# Patient Record
Sex: Male | Born: 1940 | Race: Black or African American | Hispanic: No | Smoking: Former smoker
Health system: Southern US, Community
[De-identification: ages and names within clinical notes are randomized; demographics above are authoritative.]

## PROBLEM LIST (undated history)

## (undated) DIAGNOSIS — M545 Low back pain, unspecified: Secondary | ICD-10-CM

## (undated) DIAGNOSIS — M179 Osteoarthritis of knee, unspecified: Secondary | ICD-10-CM

## (undated) DIAGNOSIS — D649 Anemia, unspecified: Secondary | ICD-10-CM

## (undated) DIAGNOSIS — I4891 Unspecified atrial fibrillation: Secondary | ICD-10-CM

## (undated) DIAGNOSIS — M1710 Unilateral primary osteoarthritis, unspecified knee: Secondary | ICD-10-CM

## (undated) DIAGNOSIS — K219 Gastro-esophageal reflux disease without esophagitis: Secondary | ICD-10-CM

## (undated) DIAGNOSIS — N189 Chronic kidney disease, unspecified: Secondary | ICD-10-CM

## (undated) DIAGNOSIS — N529 Male erectile dysfunction, unspecified: Secondary | ICD-10-CM

## (undated) DIAGNOSIS — M1990 Unspecified osteoarthritis, unspecified site: Secondary | ICD-10-CM

## (undated) DIAGNOSIS — I2699 Other pulmonary embolism without acute cor pulmonale: Secondary | ICD-10-CM

## (undated) DIAGNOSIS — G4700 Insomnia, unspecified: Secondary | ICD-10-CM

## (undated) DIAGNOSIS — T7840XA Allergy, unspecified, initial encounter: Secondary | ICD-10-CM

## (undated) DIAGNOSIS — N400 Enlarged prostate without lower urinary tract symptoms: Secondary | ICD-10-CM

## (undated) DIAGNOSIS — R600 Localized edema: Secondary | ICD-10-CM

## (undated) DIAGNOSIS — I10 Essential (primary) hypertension: Secondary | ICD-10-CM

## (undated) DIAGNOSIS — Z7901 Long term (current) use of anticoagulants: Secondary | ICD-10-CM

## (undated) DIAGNOSIS — F17201 Nicotine dependence, unspecified, in remission: Secondary | ICD-10-CM

## (undated) HISTORY — DX: Nicotine dependence, unspecified, in remission: F17.201

## (undated) HISTORY — DX: Anemia, unspecified: D64.9

## (undated) HISTORY — DX: Unilateral primary osteoarthritis, unspecified knee: M17.10

## (undated) HISTORY — PX: VARICOSE VEIN SURGERY: SHX832

## (undated) HISTORY — DX: Other pulmonary embolism without acute cor pulmonale: I26.99

## (undated) HISTORY — DX: Essential (primary) hypertension: I10

## (undated) HISTORY — DX: Osteoarthritis of knee, unspecified: M17.9

## (undated) HISTORY — DX: Insomnia, unspecified: G47.00

## (undated) HISTORY — PX: VEIN SURGERY: SHX48

## (undated) HISTORY — DX: Chronic kidney disease, unspecified: N18.9

## (undated) HISTORY — DX: Benign prostatic hyperplasia without lower urinary tract symptoms: N40.0

## (undated) HISTORY — PX: CATARACT EXTRACTION W/ INTRAOCULAR LENS IMPLANT: SHX1309

## (undated) HISTORY — DX: Gastro-esophageal reflux disease without esophagitis: K21.9

## (undated) HISTORY — DX: Low back pain: M54.5

## (undated) HISTORY — DX: Low back pain, unspecified: M54.50

## (undated) HISTORY — DX: Long term (current) use of anticoagulants: Z79.01

## (undated) HISTORY — DX: Unspecified atrial fibrillation: I48.91

## (undated) HISTORY — DX: Male erectile dysfunction, unspecified: N52.9

## (undated) HISTORY — DX: Unspecified osteoarthritis, unspecified site: M19.90

---

## 2009-12-10 HISTORY — PX: COLONOSCOPY: SHX174

## 2012-06-16 ENCOUNTER — Inpatient Hospital Stay (HOSPITAL_COMMUNITY)
Admission: EM | Admit: 2012-06-16 | Discharge: 2012-06-17 | DRG: 916 | Disposition: A | Discharge status: Home / Self Care | Payer: Medicare Other | Attending: Internal Medicine | Admitting: Internal Medicine

## 2012-06-16 ENCOUNTER — Encounter (HOSPITAL_COMMUNITY): Payer: Self-pay | Admitting: *Deleted

## 2012-06-16 DIAGNOSIS — R221 Localized swelling, mass and lump, neck: Secondary | ICD-10-CM

## 2012-06-16 DIAGNOSIS — Z79899 Other long term (current) drug therapy: Secondary | ICD-10-CM

## 2012-06-16 DIAGNOSIS — Z86711 Personal history of pulmonary embolism: Secondary | ICD-10-CM

## 2012-06-16 DIAGNOSIS — Z87891 Personal history of nicotine dependence: Secondary | ICD-10-CM

## 2012-06-16 DIAGNOSIS — T7840XA Allergy, unspecified, initial encounter: Secondary | ICD-10-CM

## 2012-06-16 DIAGNOSIS — I4891 Unspecified atrial fibrillation: Secondary | ICD-10-CM | POA: Diagnosis present

## 2012-06-16 DIAGNOSIS — D72819 Decreased white blood cell count, unspecified: Secondary | ICD-10-CM

## 2012-06-16 DIAGNOSIS — R791 Abnormal coagulation profile: Secondary | ICD-10-CM | POA: Diagnosis present

## 2012-06-16 DIAGNOSIS — D6832 Hemorrhagic disorder due to extrinsic circulating anticoagulants: Secondary | ICD-10-CM

## 2012-06-16 DIAGNOSIS — T783XXA Angioneurotic edema, initial encounter: Principal | ICD-10-CM

## 2012-06-16 DIAGNOSIS — T45515A Adverse effect of anticoagulants, initial encounter: Secondary | ICD-10-CM | POA: Diagnosis present

## 2012-06-16 DIAGNOSIS — E119 Type 2 diabetes mellitus without complications: Secondary | ICD-10-CM | POA: Diagnosis present

## 2012-06-16 DIAGNOSIS — M1710 Unilateral primary osteoarthritis, unspecified knee: Secondary | ICD-10-CM | POA: Diagnosis present

## 2012-06-16 DIAGNOSIS — I10 Essential (primary) hypertension: Secondary | ICD-10-CM

## 2012-06-16 DIAGNOSIS — D649 Anemia, unspecified: Secondary | ICD-10-CM

## 2012-06-16 DIAGNOSIS — Z7901 Long term (current) use of anticoagulants: Secondary | ICD-10-CM

## 2012-06-16 DIAGNOSIS — R220 Localized swelling, mass and lump, head: Secondary | ICD-10-CM

## 2012-06-16 HISTORY — DX: Allergy, unspecified, initial encounter: T78.40XA

## 2012-06-16 HISTORY — DX: Localized edema: R60.0

## 2012-06-16 LAB — MRSA PCR SCREENING: MRSA by PCR: NEGATIVE

## 2012-06-16 LAB — BASIC METABOLIC PANEL
Chloride: 104 mEq/L (ref 96–112)
GFR calc Af Amer: 75 mL/min — ABNORMAL LOW (ref >90)
GFR calc non Af Amer: 65 mL/min — ABNORMAL LOW (ref >90)
Potassium: 4.1 mEq/L (ref 3.5–5.1)
Sodium: 139 mEq/L (ref 135–145)

## 2012-06-16 LAB — CBC WITH DIFFERENTIAL/PLATELET
Basophils Relative: 1 % (ref 0–1)
Eosinophils Absolute: 0.1 10*3/uL (ref 0.0–0.7)
Lymphs Abs: 1.1 10*3/uL (ref 0.7–4.0)
MCH: 29.9 pg (ref 26.0–34.0)
MCHC: 31.6 g/dL (ref 30.0–36.0)
Neutro Abs: 2.4 10*3/uL (ref 1.7–7.7)
Neutrophils Relative %: 63 % (ref 43–77)
Platelets: 188 10*3/uL (ref 150–400)
RBC: 4.25 MIL/uL (ref 4.22–5.81)

## 2012-06-16 LAB — PROTIME-INR: INR: 2.13 — ABNORMAL HIGH (ref 0.00–1.49)

## 2012-06-16 MED ORDER — WARFARIN SODIUM 5 MG PO TABS: 5.0000 mg | ORAL_TABLET | Freq: Every day | ORAL | Status: DC

## 2012-06-16 MED ORDER — ONDANSETRON HCL 4 MG PO TABS: 4.0000 mg | ORAL_TABLET | Freq: Four times a day (QID) | ORAL | Status: DC | PRN

## 2012-06-16 MED ORDER — ALBUTEROL SULFATE (5 MG/ML) 0.5% IN NEBU: 2.5000 mg | INHALATION_SOLUTION | RESPIRATORY_TRACT | Status: DC | PRN

## 2012-06-16 MED ORDER — METOPROLOL TARTRATE 50 MG PO TABS
50.0000 mg | ORAL_TABLET | Freq: Two times a day (BID) | ORAL | Status: DC
  Administered 2012-06-16: 50 mg via ORAL
  Filled 2012-06-16: qty 1

## 2012-06-16 MED ORDER — WARFARIN SODIUM 6 MG PO TABS
6.0000 mg | ORAL_TABLET | Freq: Once | ORAL | Status: AC
  Administered 2012-06-16: 6 mg via ORAL
  Filled 2012-06-16: qty 1

## 2012-06-16 MED ORDER — ACETAMINOPHEN 325 MG PO TABS: 650.0000 mg | ORAL_TABLET | Freq: Four times a day (QID) | ORAL | Status: DC | PRN

## 2012-06-16 MED ORDER — HYDROCODONE-ACETAMINOPHEN 5-325 MG PO TABS
1.0000 | ORAL_TABLET | Freq: Every day | ORAL | Status: DC
  Administered 2012-06-16: 1 via ORAL
  Filled 2012-06-16: qty 1

## 2012-06-16 MED ORDER — DIPHENHYDRAMINE HCL 25 MG PO CAPS
25.0000 mg | ORAL_CAPSULE | Freq: Once | ORAL | Status: AC
  Administered 2012-06-16: 25 mg via ORAL
  Filled 2012-06-16: qty 1

## 2012-06-16 MED ORDER — FAMOTIDINE 20 MG PO TABS
20.0000 mg | ORAL_TABLET | Freq: Once | ORAL | Status: AC
  Administered 2012-06-16: 20 mg via ORAL
  Filled 2012-06-16: qty 1

## 2012-06-16 MED ORDER — HYDROCODONE-ACETAMINOPHEN 5-325 MG PO TABS
1.0000 | ORAL_TABLET | Freq: Two times a day (BID) | ORAL | Status: DC
  Administered 2012-06-16: 1 via ORAL
  Filled 2012-06-16: qty 1

## 2012-06-16 MED ORDER — WARFARIN - PHARMACIST DOSING INPATIENT: Freq: Every day | Status: DC

## 2012-06-16 MED ORDER — METHYLPREDNISOLONE SODIUM SUCC 125 MG IJ SOLR
60.0000 mg | Freq: Two times a day (BID) | INTRAMUSCULAR | Status: DC
  Administered 2012-06-16 – 2012-06-17 (×2): 60 mg via INTRAVENOUS
  Filled 2012-06-16 (×3): qty 2

## 2012-06-16 MED ORDER — FAMOTIDINE IN NACL 20-0.9 MG/50ML-% IV SOLN
20.0000 mg | Freq: Two times a day (BID) | INTRAVENOUS | Status: DC
  Administered 2012-06-16 (×2): 20 mg via INTRAVENOUS
  Filled 2012-06-16 (×3): qty 50

## 2012-06-16 MED ORDER — PREDNISONE 20 MG PO TABS
60.0000 mg | ORAL_TABLET | Freq: Once | ORAL | Status: AC
  Administered 2012-06-16: 60 mg via ORAL
  Filled 2012-06-16: qty 3

## 2012-06-16 MED ORDER — METOPROLOL TARTRATE 50 MG PO TABS
50.0000 mg | ORAL_TABLET | Freq: Every day | ORAL | Status: DC
  Administered 2012-06-16: 50 mg via ORAL
  Filled 2012-06-16 (×2): qty 1

## 2012-06-16 MED ORDER — POTASSIUM CHLORIDE IN NACL 20-0.9 MEQ/L-% IV SOLN
INTRAVENOUS | Status: DC
  Administered 2012-06-16 – 2012-06-17 (×2): via INTRAVENOUS
  Filled 2012-06-16: qty 1000

## 2012-06-16 MED ORDER — WARFARIN SODIUM 1 MG PO TABS: 1.0000 mg | ORAL_TABLET | ORAL | Status: DC

## 2012-06-16 MED ORDER — ONDANSETRON HCL 4 MG/2ML IJ SOLN: 4.0000 mg | Freq: Four times a day (QID) | INTRAMUSCULAR | Status: DC | PRN

## 2012-06-16 MED ORDER — DIPHENHYDRAMINE HCL 50 MG/ML IJ SOLN
25.0000 mg | Freq: Four times a day (QID) | INTRAMUSCULAR | Status: DC
  Administered 2012-06-16 – 2012-06-17 (×4): 25 mg via INTRAVENOUS
  Filled 2012-06-16 (×4): qty 1

## 2012-06-16 MED ORDER — ACETAMINOPHEN 650 MG RE SUPP: 650.0000 mg | Freq: Four times a day (QID) | RECTAL | Status: DC | PRN

## 2012-06-16 NOTE — ED Notes (Signed)
Pt woke up with tongue swelling. Pt also c/o sob.

## 2012-06-16 NOTE — ED Notes (Signed)
Pam from ICU called and will call when bed available in unit. Pt resting in bed with nad noted. Handling secretions without difficulty. Pt watching TV

## 2012-06-16 NOTE — Progress Notes (Signed)
ANTICOAGULATION CONSULT NOTE - Initial Consult  Pharmacy Consult for Warfarin Indication: VTE  No Known Allergies  Patient Measurements: Height: 5\' 11"  (180.3 cm) Weight: 205 lb (92.987 kg) IBW/kg (Calculated) : 75.3   Vital Signs: Temp: 98.4 F (36.9 C) (07/08 1119) Temp src: Oral (07/08 1119) BP: 150/89 mmHg (07/08 1119) Pulse Rate: 85  (07/08 1119)  Labs:  Basename 06/16/12 0753  HGB 12.7*  HCT 40.2  PLT 188  APTT --  LABPROT 24.2*  INR 2.13*  HEPARINUNFRC --  CREATININE 1.11  CKTOTAL --  CKMB --  TROPONINI --    Estimated Creatinine Clearance: 71.1 ml/min (by C-G formula based on Cr of 1.11).   Medical History: Past Medical History  Diagnosis Date  . PE (pulmonary thromboembolism) 2003 or 2004  . Hypertension   . GERD (gastroesophageal reflux disease)   . Bilateral chronic knee pain DJD  . Leg edema, right Chronic    Status post vein stripping for varicose veins 1990s    Medications:  Scheduled:    . diphenhydrAMINE  25 mg Oral Once  . diphenhydrAMINE  25 mg Intravenous Q6H  . famotidine  20 mg Oral Once  . HYDROcodone-acetaminophen  1 tablet Oral BID  . methylPREDNISolone (SOLU-MEDROL) injection  60 mg Intravenous Q12H  . metoprolol  50 mg Oral BID  . predniSONE  60 mg Oral Once  . warfarin  1 mg Oral 3 times weekly  . warfarin  5 mg Oral Daily    Assessment: Okay for Protocol Home Dose 5mg  T-Th-Sat-Sun and 6mg  M-W-F.  Goal of Therapy:  INR 2-3   Plan:  Warfarin 6mg  PO x 1 today. Daily PT/INR.  Mady Gemma 06/16/2012,11:44 AM

## 2012-06-16 NOTE — ED Notes (Signed)
ICU room 7 being cleaned at present time- waiting to give report.

## 2012-06-16 NOTE — ED Notes (Signed)
Tongue swollen. Handling secretions without difficulty. resp even/nonlabored. sats maintained on RA

## 2012-06-16 NOTE — H&P (Signed)
Allen Marshall MRN: [id number] DOB/AGE: [birth year] 71 y.o. Primary Care Physician:SIDHU,AMANJOT, MD Admit date: 06/16/2012 Chief Complaint: Tongue swelling HPI: The patient is a 71 year old man with a history significant for pulmonary embolism and hypertension, who presents to the emergency department this morning with a chief complaint of tongue swelling. This morning, at approximately 3:00 this morning, he got up to use the bathroom. He noticed that his tongue and mouth "felt funny." As each hour when on, his tongue became more and more swollen. He felt that he may have bitten his tongue while he was asleep. As time went on, he became concerned, but he had no shortness of breath or difficulty swallowing. He did and does have trouble speaking because of his swollen tongue. He denies taking any new medications. He is not on an ACE inhibitor. He denies itching, rashes, chest pain, pleurisy, fever, chills, shortness of breath, nausea, vomiting, diarrhea, or abdominal pain. Before he went to bed last night, he ate a bowl of chili from [facility]. He had not eaten chili from [facility] before. Late afternoon, he ate watermelon which he had done before on many occasions.  In the emergency department, he is hypertensive with a blood pressure 170/112, but hemodynamically stable and afebrile. He is oxygenating 97% on room air. His lab data are significant for a WBC of 3.9, hemoglobin of 12.7, an INR of 2.13. He is being admitted for further evaluation and management.  Past Medical History  Diagnosis Date  . PE (pulmonary thromboembolism) 2003 or 2004  . Hypertension   . GERD (gastroesophageal reflux disease)   . Bilateral chronic knee pain DJD  . Leg edema, right Chronic    Status post vein stripping for varicose veins 1990s    Past Surgical History  Procedure Date  . Varicose vein surgery 1970s    Prior to Admission medications   Medication Sig Start Date End Date Taking? Authorizing Provider  acetaminophen  (TYLENOL) 500 MG tablet Take 1,000 mg by mouth every 6 (six) hours as needed. Pain   Yes Historical Provider, MD  Cyanocobalamin (VITAMIN B-12) 2000 MCG TBCR Take 2,000 mcg by mouth daily.   Yes Historical Provider, MD  furosemide (LASIX) 20 MG tablet Take 20 mg by mouth 3 (three) times a week. Takes on Mon, Wed, and Fri.   Yes Historical Provider, MD  HYDROcodone-acetaminophen (NORCO) 5-325 MG per tablet Take 1 tablet by mouth 2 (two) times daily. Pain   Yes Historical Provider, MD  metoprolol (LOPRESSOR) 50 MG tablet Take 50 mg by mouth 2 (two) times daily.   Yes Historical Provider, MD  omeprazole (PRILOSEC) 20 MG capsule Take 20 mg by mouth daily.   Yes Historical Provider, MD  warfarin (COUMADIN) 1 MG tablet Take 1 mg by mouth 3 (three) times a week. Takes on Mon, Wed, and Fri along with the 5 mg tablet to make 6 mg on these days.   Yes Historical Provider, MD  warfarin (COUMADIN) 5 MG tablet Take 5 mg by mouth daily.   Yes Historical Provider, MD    Allergies: No Known Allergies  Family history: His mother is 77 years of age and has a history of heart disease and esophageal reflux. His father died of Alzheimer's disease in his 80s.  Social History: The patient is widowed. He has no children. He recently moved to [facility] from [place]. He is a retired [employer] architect. He stopped drinking alcohol and smoking cigarettes approximately 10 years ago.     ROS: As above  in history present illness. In addition, he has chronic right leg swelling. Otherwise, review of systems is negative.  PHYSICAL EXAM: Blood pressure 150/89, pulse 85, temperature 98.4 F (36.9 C), temperature source Oral, resp. rate 16, height 5\' 11"  (1.803 m), weight 92.987 kg (205 lb), SpO2 97.00%. General: Pleasant alert 71 year old after American man laying in bed, in no acute distress. HEENT: Head is normocephalic, nontraumatic. Pupils are equal, round, and reactive to light. Extraocular movements are intact.  Conjunctivae are clear. Sclerae are white. Oropharynx reveals a large swollen tongue with no excoriations, erythema, or lacerations. Posterior exudates are absent. No posterior erythema. Gums are in good repair. Mucous membranes are mildly dry. Neck: Supple, no adenopathy, no thyromegaly, no JVD. Lungs: Clear to auscultation bilaterally. Breathing is nonlabored. Heart: S1, S2, with no murmurs rubs or gallops. Abdomen: Positive bowel sounds, soft, nontender, nondistended. Extremities: Bandage on the right leg not examined yet. Globally, 2-3+ nonpitting edema which the patient says is chronic. No pedal edema or pretibial edema of his left leg. No acute hot red joints. Skin: No rashes. Good turgor. Neurologic: Cranial nerves II through XII are intact, except for mild dysarthria because of the edematous tongue. Strength is 5 over 5 throughout. Sensation is intact. Psychiatric: He is alert and oriented x3. His affect is pleasant. His speech is clear. He is cooperative.  Basic Metabolic Panel:  Basename 06/16/12 0753  NA 139  K 4.1  CL 104  CO2 27  GLUCOSE 109*  BUN 12  CREATININE 1.11  CALCIUM 9.4  MG --  PHOS --   Liver Function Tests: No results found for this basename: AST:2,ALT:2,ALKPHOS:2,BILITOT:2,PROT:2,ALBUMIN:2 in the last 72 hours No results found for this basename: LIPASE:2,AMYLASE:2 in the last 72 hours No results found for this basename: AMMONIA:2 in the last 72 hours CBC:  Basename 06/16/12 0753  WBC 3.9*  NEUTROABS 2.4  HGB 12.7*  HCT 40.2  MCV 94.6  PLT 188   Cardiac Enzymes: No results found for this basename: CKTOTAL:3,CKMB:3,CKMBINDEX:3,TROPONINI:3 in the last 72 hours BNP: No results found for this basename: PROBNP:3 in the last 72 hours D-Dimer: No results found for this basename: DDIMER:2 in the last 72 hours CBG: No results found for this basename: GLUCAP:6 in the last 72 hours Hemoglobin A1C: No results found for this basename: HGBA1C in the last 72  hours Fasting Lipid Panel: No results found for this basename: CHOL,HDL,LDLCALC,TRIG,CHOLHDL,LDLDIRECT in the last 72 hours Thyroid Function Tests: No results found for this basename: TSH,T4TOTAL,FREET4,T3FREE,THYROIDAB in the last 72 hours Anemia Panel: No results found for this basename: VITAMINB12,FOLATE,FERRITIN,TIBC,IRON,RETICCTPCT in the last 72 hours Coagulation:  Basename 06/16/12 0753  LABPROT 24.2*  INR 2.13*   Urine Drug Screen: Drugs of Abuse  No results found for this basename: labopia, cocainscrnur, labbenz, amphetmu, thcu, labbarb    Alcohol Level: No results found for this basename: ETH:2 in the last 72 hours Urinalysis: No results found for this basename: COLORURINE:2,APPERANCEUR:2,LABSPEC:2,PHURINE:2,GLUCOSEU:2,HGBUR:2,BILIRUBINUR:2,KETONESUR:2,PROTEINUR:2,UROBILINOGEN:2,NITRITE:2,LEUKOCYTESUR:2 in the last 72 hours   No results found for this or any previous visit (from the past 240 hour(s)).   Results for orders placed during the hospital encounter of 06/16/12 (from the past 48 hour(s))  CBC WITH DIFFERENTIAL     Status: Abnormal   Collection Time   06/16/12  7:53 AM      Component Value Range Comment   WBC 3.9 (*) 4.0 - 10.5 K/uL    RBC 4.25  4.22 - 5.81 MIL/uL    Hemoglobin 12.7 (*) 13.0 - 17.0 g/dL  HCT 40.2  39.0 - 52.0 %    MCV 94.6  78.0 - 100.0 fL    MCH 29.9  26.0 - 34.0 pg    MCHC 31.6  30.0 - 36.0 g/dL    RDW 16.1  09.6 - 04.5 %    Platelets 188  150 - 400 K/uL    Neutrophils Relative 63  43 - 77 %    Neutro Abs 2.4  1.7 - 7.7 K/uL    Lymphocytes Relative 29  12 - 46 %    Lymphs Abs 1.1  0.7 - 4.0 K/uL    Monocytes Relative 5  3 - 12 %    Monocytes Absolute 0.2  0.1 - 1.0 K/uL    Eosinophils Relative 2  0 - 5 %    Eosinophils Absolute 0.1  0.0 - 0.7 K/uL    Basophils Relative 1  0 - 1 %    Basophils Absolute 0.0  0.0 - 0.1 K/uL   BASIC METABOLIC PANEL     Status: Abnormal   Collection Time   06/16/12  7:53 AM      Component Value Range  Comment   Sodium 139  135 - 145 mEq/L    Potassium 4.1  3.5 - 5.1 mEq/L    Chloride 104  96 - 112 mEq/L    CO2 27  19 - 32 mEq/L    Glucose, Bld 109 (*) 70 - 99 mg/dL    BUN 12  6 - 23 mg/dL    Creatinine, Ser 4.09  0.50 - 1.35 mg/dL    Calcium 9.4  8.4 - 81.1 mg/dL    GFR calc non Af Amer 65 (*) >90 mL/min    GFR calc Af Amer 75 (*) >90 mL/min   PROTIME-INR     Status: Abnormal   Collection Time   06/16/12  7:53 AM      Component Value Range Comment   Prothrombin Time 24.2 (*) 11.6 - 15.2 seconds    INR 2.13 (*) 0.00 - 1.49     No results found.  Impression:  Principal Problem:  *Angioedema Active Problems:  Tongue swelling  HTN (hypertension), malignant  Leukopenia  Anemia  Hx pulmonary embolism  Warfarin-induced coagulopathy   This is a 71 year old man who presents with tongue swelling/angioedema. Etiology not clearly known, but the likely culprit is some substance in the chili which he had never  eaten before at [facility]. Would consider medications as etiology, as in rare occasions, proton pump inhibitors and beta blockers can cause angioedema. However, secondary to the temporal relationship of the chili and his symptomatology, it is likely that a possible allergic reaction to some substance in the chili had caused his swelling. Symptomatically, he says that the swelling has decreased after being given treatment in the emergency department. He has no difficulty swallowing as he took prednisone, Benadryl, and Pepcid with no problem earlier. He is noted to have a history of PE. His INR is therapeutic. He has malignant hypertension, but he has not taken metoprolol yet. He has mild anemia and leukopenia.  Plan: 1. We'll admit the patient to the ICU for closer monitoring. 2. We'll continue him. Treatment with Benadryl, Solu-Medrol, and Pepcid intravenously. 3. We'll try to minimize oral medications. 4. Will restart metoprolol and Coumadin. We'll ask the pharmacy staff to follow  his INR and adjust dosing as appropriate. 5. For further evaluation, will check a TSH, vitamin B12, and total iron. 6. Gentle IV fluids. 7. The patient  was advised not to be chili from [facility] again.      Charday Capetillo 06/16/2012, 11:40 AM

## 2012-06-16 NOTE — ED Notes (Signed)
Spoke with Pam in ICU- room still not ready for pt.

## 2012-06-16 NOTE — ED Notes (Signed)
Pt woke up at 3:30 with his tongue swelling. Pt states swelling is getting progressively worse.

## 2012-06-16 NOTE — ED Notes (Signed)
Dr. Sherrie Mustache at [facility].

## 2012-06-16 NOTE — ED Provider Notes (Signed)
History     CSN: [id number]  Arrival date & time 06/16/12  0545   First MD Initiated Contact with Patient 06/16/12 [phone]      Chief Complaint  Patient presents with  . Oral Swelling    (Consider location/radiation/quality/duration/timing/severity/associated sxs/prior treatment) HPI Comments: 71 year old male with a history of diabetes, atrial fibrillation, deep venous thrombosis. He presents with a complaint of tongue swelling. He states that he awoke with this approximately 45 minutes ago, is persistent, gradually increasing in size and not associated with chest pain difficulty breathing difficulty swallowing. He does have some garbled speech because of the difficulty with using his tongue to talk. The symptoms occurred sometime overnight, he is unsure when they exactly started as he awoke with them. He denies starting any new medications and has not been on an ACE inhibitor per his report. He denies itching, rashes, swelling of the extremities. There has been no fevers, chills, nausea, vomiting, back pain, rashes. He has persistent and chronic swelling of his right lower extremity.  The history is provided by the patient.    Past Medical History  Diagnosis Date  . Diabetes mellitus     History reviewed. No pertinent past surgical history.  History reviewed. No pertinent family history.  History  Substance Use Topics  . Smoking status: Former Games developer  . Smokeless tobacco: Not on file  . Alcohol Use: No      Review of Systems  All other systems reviewed and are negative.    Allergies  Review of patient's allergies indicates no known allergies.  Home Medications   Current Outpatient Rx  Name Route Sig Dispense Refill  . ACETAMINOPHEN 500 MG PO TABS Oral Take 500 mg by mouth every 6 (six) hours as needed.    . FUROSEMIDE 20 MG PO TABS Oral Take 20 mg by mouth 2 (two) times daily.    Marland Kitchen HYDROCODONE-ACETAMINOPHEN 5-325 MG PO TABS Oral Take 1 tablet by mouth 2 (two) times  daily as needed.    Marland Kitchen METOPROLOL TARTRATE 50 MG PO TABS Oral Take 50 mg by mouth 2 (two) times daily.    Marland Kitchen OMEPRAZOLE 20 MG PO CPDR Oral Take 20 mg by mouth daily.    . WARFARIN SODIUM 1 MG PO TABS Oral Take 1 mg by mouth as directed.    . WARFARIN SODIUM 5 MG PO TABS Oral Take 5 mg by mouth daily.      BP 170/112  Pulse 90  Temp 98.4 F (36.9 C) (Oral)  Resp 16  Ht 5\' 11"  (1.803 m)  Wt 205 lb (92.987 kg)  BMI 28.59 kg/m2  SpO2 97%  Physical Exam  Nursing note and vitals reviewed. Constitutional: He appears well-developed and well-nourished. No distress.  HENT:  Head: Normocephalic and atraumatic.  Mouth/Throat: No oropharyngeal exudate.       Angioedema of the tongue, oropharynx visualized, mucous membranes moist  Eyes: Conjunctivae and EOM are normal. Pupils are equal, round, and reactive to light. Right eye exhibits no discharge. Left eye exhibits no discharge. No scleral icterus.  Neck: Normal range of motion. Neck supple. No JVD present. No thyromegaly present.  Cardiovascular: Normal rate, regular rhythm, normal heart sounds and intact distal pulses.  Exam reveals no gallop and no friction rub.   No murmur heard. Pulmonary/Chest: Effort normal and breath sounds normal. No respiratory distress. He has no wheezes. He has no rales.  Abdominal: Soft. Bowel sounds are normal. He exhibits no distension and no mass. There is no  tenderness.  Musculoskeletal: Normal range of motion. He exhibits edema ( right greater than left asymmetrical swelling of the lower extremities). He exhibits no tenderness.  Lymphadenopathy:    He has no cervical adenopathy.  Neurological: He is alert. Coordination normal.  Skin: Skin is warm and dry. No rash noted. No erythema.  Psychiatric: He has a normal mood and affect. His behavior is normal.    ED Course  Procedures (including critical care time)  Labs Reviewed  CBC WITH DIFFERENTIAL - Abnormal; Notable for the following:    WBC 3.9 (*)      Hemoglobin 12.7 (*)     All other components within normal limits  BASIC METABOLIC PANEL - Abnormal; Notable for the following:    Glucose, Bld 109 (*)     GFR calc non Af Amer 65 (*)     GFR calc Af Amer 75 (*)     All other components within normal limits   No results found.   1. Angioedema       MDM  The patient states that he has had a vein stripping procedure in the past after which time he developed swelling of the right lower extremity, this is not a new finding. His angioedema however is a new finding, review of his medication shows that he is not on an ACE inhibitor or an angiotensin receptor blocker.  Review of the literature shows that both metoprolol and Prilosec can also cause angioedema.   Patient's angioedema has worsened while in the emergency department, still maintaining his airway, consistency the posterior pharynx when he opens his mouth, will admit the patient to the hospitalist. Discussed with Dr. Pollyann Savoy, MD 06/16/12 [phone]

## 2012-06-17 ENCOUNTER — Encounter (HOSPITAL_COMMUNITY): Payer: Self-pay | Admitting: Internal Medicine

## 2012-06-17 DIAGNOSIS — I10 Essential (primary) hypertension: Secondary | ICD-10-CM

## 2012-06-17 DIAGNOSIS — T7840XA Allergy, unspecified, initial encounter: Secondary | ICD-10-CM

## 2012-06-17 HISTORY — DX: Allergy, unspecified, initial encounter: T78.40XA

## 2012-06-17 LAB — BASIC METABOLIC PANEL
CO2: 25 mEq/L (ref 19–32)
Chloride: 105 mEq/L (ref 96–112)
GFR calc Af Amer: 75 mL/min — ABNORMAL LOW (ref >90)
Sodium: 138 mEq/L (ref 135–145)

## 2012-06-17 LAB — CBC
HCT: 39.2 % (ref 39.0–52.0)
MCV: 93.6 fL (ref 78.0–100.0)
Platelets: 214 10*3/uL (ref 150–400)
RBC: 4.19 MIL/uL — ABNORMAL LOW (ref 4.22–5.81)
WBC: 9.7 10*3/uL (ref 4.0–10.5)

## 2012-06-17 LAB — PROTIME-INR: INR: 2.41 — ABNORMAL HIGH (ref 0.00–1.49)

## 2012-06-17 MED ORDER — PREDNISONE 10 MG PO TABS: ORAL_TABLET | ORAL | Status: AC

## 2012-06-17 MED ORDER — DIPHENHYDRAMINE HCL 25 MG PO TABS: 25.0000 mg | ORAL_TABLET | Freq: Two times a day (BID) | ORAL | Status: DC

## 2012-06-17 MED ORDER — FAMOTIDINE 10 MG PO CHEW: 20.0000 mg | CHEWABLE_TABLET | Freq: Two times a day (BID) | ORAL | Status: DC

## 2012-06-17 MED ORDER — DICLOFENAC SODIUM 1 % TD GEL: 1.0000 "application " | Freq: Three times a day (TID) | TRANSDERMAL | Status: DC | PRN

## 2012-06-17 MED ORDER — METOPROLOL TARTRATE 50 MG PO TABS
50.0000 mg | ORAL_TABLET | Freq: Once | ORAL | Status: AC
  Administered 2012-06-17: 50 mg via ORAL

## 2012-06-17 NOTE — Progress Notes (Signed)
All discharge instructions and prescriptions gone over Allen Marshall Allen Marshall using the teach back method. All questions and concerns answered. Gave Allen Marshall 4 local MD numbers ([place], [place],and [place].) Allen Marshall discharged with personal belongings.

## 2012-06-17 NOTE — Discharge Summary (Signed)
Physician Discharge Summary  Allen Marshall MRN: [id number] DOB/AGE: Sep 17, 1941 71 y.o.  PCP: Melida Quitter, MD   Admit date: 06/16/2012 Discharge date: 06/17/2012  Discharge Diagnoses:  1. Likely allergic reaction manifested with angioedema/tongue swelling after eating chili at [facility]. 2. Malignant hypertension. 3. History of pulmonary embolism with chronic warfarin anticoagulation. 4. Mild anemia. Anemia panel was within normal limits. 5. Degenerative joint disease of both knees. 6. Mild leukopenia, resolved.    Medication List  As of 06/17/2012  9:16 AM   TAKE these medications         acetaminophen 500 MG tablet   Commonly known as: TYLENOL   Take 1,000 mg by mouth every 6 (six) hours as needed. Pain      diclofenac sodium 1 % Gel   Commonly known as: VOLTAREN   Apply 1 application topically 3 (three) times daily as needed (For arthritis pain.).      diphenhydrAMINE 25 MG tablet   Commonly known as: BENADRYL   Take 1 tablet (25 mg total) by mouth 2 (two) times daily before a meal. Take for 4 more days.      famotidine 10 MG chewable tablet   Commonly known as: PEPCID AC   Chew 2 tablets (20 mg total) by mouth 2 (two) times daily. Take for more 4 days.      furosemide 20 MG tablet   Commonly known as: LASIX   Take 20 mg by mouth 3 (three) times a week. Takes on Mon, Wed, and Fri.      HYDROcodone-acetaminophen 5-325 MG per tablet   Commonly known as: NORCO   Take 1 tablet by mouth 2 (two) times daily. Pain      metoprolol 50 MG tablet   Commonly known as: LOPRESSOR   Take 50 mg by mouth 2 (two) times daily.      omeprazole 20 MG capsule   Commonly known as: PRILOSEC   Take 20 mg by mouth daily.      predniSONE 10 MG tablet   Commonly known as: DELTASONE   TAKE 4 TABLETS TODAY; THEN 3 TABLETS THE NEXT DAY; THEN 2 TABLETS NEXT DAY; THEN 1 TABLET THE NEXT DAY; THEN STOP.      Vitamin B-12 2000 MCG Tbcr   Take 2,000 mcg by mouth daily.      warfarin 1 MG  tablet   Commonly known as: COUMADIN   Take 1 mg by mouth 3 (three) times a week. Takes on Mon, Wed, and Fri along with the 5 mg tablet to make 6 mg on these days.      warfarin 5 MG tablet   Commonly known as: COUMADIN   Take 5 mg by mouth daily.            Discharge Condition: Improved.  Disposition: Final discharge disposition not confirmed   Consults: None.   Significant Diagnostic Studies:   Microbiology: Recent Results (from the past 240 hour(s))  MRSA PCR SCREENING     Status: Normal   Collection Time   06/16/12  4:00 PM      Component Value Range Status Comment   MRSA by PCR NEGATIVE  NEGATIVE Final      Labs: Results for orders placed during the hospital encounter of 06/16/12 (from the past 48 hour(s))  CBC WITH DIFFERENTIAL     Status: Abnormal   Collection Time   06/16/12  7:53 AM      Component Value Range Comment   WBC 3.9 (*)  4.0 - 10.5 K/uL    RBC 4.25  4.22 - 5.81 MIL/uL    Hemoglobin 12.7 (*) 13.0 - 17.0 g/dL    HCT 78.2  95.6 - 21.3 %    MCV 94.6  78.0 - 100.0 fL    MCH 29.9  26.0 - 34.0 pg    MCHC 31.6  30.0 - 36.0 g/dL    RDW 08.6  57.8 - 46.9 %    Platelets 188  150 - 400 K/uL    Neutrophils Relative 63  43 - 77 %    Neutro Abs 2.4  1.7 - 7.7 K/uL    Lymphocytes Relative 29  12 - 46 %    Lymphs Abs 1.1  0.7 - 4.0 K/uL    Monocytes Relative 5  3 - 12 %    Monocytes Absolute 0.2  0.1 - 1.0 K/uL    Eosinophils Relative 2  0 - 5 %    Eosinophils Absolute 0.1  0.0 - 0.7 K/uL    Basophils Relative 1  0 - 1 %    Basophils Absolute 0.0  0.0 - 0.1 K/uL   BASIC METABOLIC PANEL     Status: Abnormal   Collection Time   06/16/12  7:53 AM      Component Value Range Comment   Sodium 139  135 - 145 mEq/L    Potassium 4.1  3.5 - 5.1 mEq/L    Chloride 104  96 - 112 mEq/L    CO2 27  19 - 32 mEq/L    Glucose, Bld 109 (*) 70 - 99 mg/dL    BUN 12  6 - 23 mg/dL    Creatinine, Ser 6.29  0.50 - 1.35 mg/dL    Calcium 9.4  8.4 - 52.8 mg/dL    GFR calc non Af  Amer 65 (*) >90 mL/min    GFR calc Af Amer 75 (*) >90 mL/min   PROTIME-INR     Status: Abnormal   Collection Time   06/16/12  7:53 AM      Component Value Range Comment   Prothrombin Time 24.2 (*) 11.6 - 15.2 seconds    INR 2.13 (*) 0.00 - 1.49   VITAMIN B12     Status: Normal   Collection Time   06/16/12  7:53 AM      Component Value Range Comment   Vitamin B-12 523  211 - 911 pg/mL   IRON AND TIBC     Status: Normal   Collection Time   06/16/12  7:53 AM      Component Value Range Comment   Iron 64  42 - 135 ug/dL    TIBC 413  244 - 010 ug/dL    Saturation Ratios 28  20 - 55 %    UIBC 166  125 - 400 ug/dL   TSH     Status: Normal   Collection Time   06/16/12  7:53 AM      Component Value Range Comment   TSH 2.635  0.350 - 4.500 uIU/mL   MRSA PCR SCREENING     Status: Normal   Collection Time   06/16/12  4:00 PM      Component Value Range Comment   MRSA by PCR NEGATIVE  NEGATIVE   BASIC METABOLIC PANEL     Status: Abnormal   Collection Time   06/17/12  4:33 AM      Component Value Range Comment   Sodium 138  135 - 145 mEq/L  Potassium 3.9  3.5 - 5.1 mEq/L    Chloride 105  96 - 112 mEq/L    CO2 25  19 - 32 mEq/L    Glucose, Bld 134 (*) 70 - 99 mg/dL    BUN 13  6 - 23 mg/dL    Creatinine, Ser 1.61  0.50 - 1.35 mg/dL    Calcium 9.4  8.4 - 09.6 mg/dL    GFR calc non Af Amer 65 (*) >90 mL/min    GFR calc Af Amer 75 (*) >90 mL/min   CBC     Status: Abnormal   Collection Time   06/17/12  4:33 AM      Component Value Range Comment   WBC 9.7  4.0 - 10.5 K/uL    RBC 4.19 (*) 4.22 - 5.81 MIL/uL    Hemoglobin 12.6 (*) 13.0 - 17.0 g/dL    HCT 04.5  40.9 - 81.1 %    MCV 93.6  78.0 - 100.0 fL    MCH 30.1  26.0 - 34.0 pg    MCHC 32.1  30.0 - 36.0 g/dL    RDW 91.4  78.2 - 95.6 %    Platelets 214  150 - 400 K/uL   PROTIME-INR     Status: Abnormal   Collection Time   06/17/12  4:33 AM      Component Value Range Comment   Prothrombin Time 26.6 (*) 11.6 - 15.2 seconds    INR 2.41 (*) 0.00  - 1.49      HPI : The patient is a 71 year old man with a history significant for pulmonary embolism and hypertension, who presented to the emergency department on 06/16/2012 with a chief complaint of tongue swelling. Several hours after eating chili, the swelling started. He had no complaints of difficulty swallowing or difficulty breathing. In the emergency department, he was hypertensive with a blood pressure 170/112, but otherwise hemodynamically stable and afebrile. He was oxygenating 97% on room air. His lab data were significant for a WBC of 3.9, hemoglobin of 12.7, and an INR of 2.13. He was admitted for further evaluation and management.  HOSPITAL COURSE: The patient had no airway compromise or significant otitis aphasia or dysphagia. He did have some dysarthria secondary to his edematous tongue. The patient was given prednisone, Benadryl, and Pepcid in the emergency department. He was continued on IV Solu-Medrol, IV Pepcid, and IV Benadryl. He was monitored in the ICU. He was started on gentle IV fluids. All of his chronic medications were restarted. Once he was given metoprolol, his blood pressure improved. For further evaluation of mild anemia and mild leukopenia, a number of studies were ordered. His total iron was within normal limits at 64, TIBC was within normal limits at 2:30, percent saturation was within normal limits at 28, and vitamin B12 level was within normal limits at 523. His TSH was within normal limits at 2.6. His INR remained therapeutic ranging from 2-2.4.  Following treatment, the next morning, the patient's tongue swelling completely resolved. It was likely that the culprit was some spice, substance, or additives  in the chili that he ate at [facility]. He was strongly advised to not eat chili at [facility] ever again. He voiced understanding. He was discharged to home in improved condition. He was advised to continue the prednisone taper, Benadryl, and Pepcid over the next 4 days. He  voiced understanding.    Discharge Exam:  Blood pressure 151/110, pulse 91, temperature 98 F (36.7 C), temperature source Oral, resp.  rate 22, height 5\' 11"  (1.803 m), weight 90.2 kg (198 lb 13.7 oz), SpO2 98.00%.  Oropharynx: No evidence of swelling or edema of his tongue. The size of his tongue has normalized. Lungs: Clear to auscultation bilaterally. Next line heart: S1, S2, with a soft systolic murmur. Abdomen: Positive bowel sounds, soft, nontender, nondistended. Extremities: Chronic 2-3+ nonpitting edema of the right lower extremity. No edema of the left lower extremity. Neurologic: Resolution of dysarthria. Alert and oriented x3. Cranial nerves II through XII are intact.   Discharge Orders    Future Orders Please Complete By Expires   Diet - low sodium heart healthy      Increase activity slowly      Discharge instructions      Comments:   DO NOT EAT ANY MORE CHILLI FROM [facility].      Follow-up Information    Please follow up. (Followup with your primary care doctor in one to 2 weeks.)          Total discharge time: Greater than 35 minutes.   Signed: Jamaine Quintin 06/17/2012, 9:16 AM

## 2012-07-16 NOTE — Progress Notes (Signed)
UR chart review completed.  

## 2012-09-23 ENCOUNTER — Ambulatory Visit: Payer: Medicare Other | Admitting: Urology

## 2012-12-26 ENCOUNTER — Inpatient Hospital Stay (HOSPITAL_COMMUNITY)
Admission: EM | Admit: 2012-12-26 | Discharge: 2012-12-30 | DRG: 176 | Disposition: A | Discharge status: Home / Self Care | Payer: Medicare Other | Attending: Internal Medicine | Admitting: Internal Medicine

## 2012-12-26 ENCOUNTER — Encounter (HOSPITAL_COMMUNITY): Payer: Self-pay | Admitting: *Deleted

## 2012-12-26 ENCOUNTER — Emergency Department (HOSPITAL_COMMUNITY): Payer: Medicare Other

## 2012-12-26 DIAGNOSIS — I10 Essential (primary) hypertension: Secondary | ICD-10-CM | POA: Diagnosis present

## 2012-12-26 DIAGNOSIS — I4891 Unspecified atrial fibrillation: Secondary | ICD-10-CM | POA: Diagnosis present

## 2012-12-26 DIAGNOSIS — Z7901 Long term (current) use of anticoagulants: Secondary | ICD-10-CM

## 2012-12-26 DIAGNOSIS — Z87891 Personal history of nicotine dependence: Secondary | ICD-10-CM

## 2012-12-26 DIAGNOSIS — R791 Abnormal coagulation profile: Secondary | ICD-10-CM | POA: Diagnosis present

## 2012-12-26 DIAGNOSIS — I2699 Other pulmonary embolism without acute cor pulmonale: Secondary | ICD-10-CM

## 2012-12-26 DIAGNOSIS — Z86711 Personal history of pulmonary embolism: Secondary | ICD-10-CM

## 2012-12-26 DIAGNOSIS — I2782 Chronic pulmonary embolism: Secondary | ICD-10-CM | POA: Diagnosis present

## 2012-12-26 DIAGNOSIS — R220 Localized swelling, mass and lump, head: Secondary | ICD-10-CM

## 2012-12-26 DIAGNOSIS — I509 Heart failure, unspecified: Secondary | ICD-10-CM | POA: Diagnosis present

## 2012-12-26 DIAGNOSIS — K219 Gastro-esophageal reflux disease without esophagitis: Secondary | ICD-10-CM | POA: Diagnosis present

## 2012-12-26 DIAGNOSIS — T45515A Adverse effect of anticoagulants, initial encounter: Secondary | ICD-10-CM | POA: Diagnosis present

## 2012-12-26 DIAGNOSIS — I2789 Other specified pulmonary heart diseases: Secondary | ICD-10-CM | POA: Diagnosis present

## 2012-12-26 DIAGNOSIS — D72819 Decreased white blood cell count, unspecified: Secondary | ICD-10-CM

## 2012-12-26 DIAGNOSIS — I50810 Right heart failure, unspecified: Secondary | ICD-10-CM | POA: Diagnosis present

## 2012-12-26 LAB — CBC WITH DIFFERENTIAL/PLATELET
Band Neutrophils: 0 % (ref 0–10)
Basophils Relative: 1 % (ref 0–1)
Eosinophils Absolute: 0.1 10*3/uL (ref 0.0–0.7)
Eosinophils Relative: 1 % (ref 0–5)
HCT: 43.2 % (ref 39.0–52.0)
Lymphocytes Relative: 42 % (ref 12–46)
MCV: 93.9 fL (ref 78.0–100.0)
Neutro Abs: 2.4 10*3/uL (ref 1.7–7.7)
Platelets: 192 10*3/uL (ref 150–400)
RBC: 4.6 MIL/uL (ref 4.22–5.81)
WBC: 4.9 10*3/uL (ref 4.0–10.5)

## 2012-12-26 LAB — COMPREHENSIVE METABOLIC PANEL
AST: 15 U/L (ref 0–37)
Albumin: 4.2 g/dL (ref 3.5–5.2)
Alkaline Phosphatase: 67 U/L (ref 39–117)
CO2: 26 mEq/L (ref 19–32)
Chloride: 106 mEq/L (ref 96–112)
GFR calc non Af Amer: 57 mL/min — ABNORMAL LOW (ref >90)
Potassium: 4.7 mEq/L (ref 3.5–5.1)
Total Bilirubin: 1.1 mg/dL (ref 0.3–1.2)

## 2012-12-26 LAB — PRO B NATRIURETIC PEPTIDE: Pro B Natriuretic peptide (BNP): 2253 pg/mL — ABNORMAL HIGH (ref 0–125)

## 2012-12-26 MED ORDER — IPRATROPIUM BROMIDE 0.02 % IN SOLN: 0.5000 mg | Freq: Four times a day (QID) | RESPIRATORY_TRACT | Status: DC | PRN

## 2012-12-26 MED ORDER — IOHEXOL 350 MG/ML SOLN
100.0000 mL | Freq: Once | INTRAVENOUS | Status: AC | PRN
  Administered 2012-12-26: 100 mL via INTRAVENOUS

## 2012-12-26 MED ORDER — WARFARIN SODIUM 7.5 MG PO TABS
7.5000 mg | ORAL_TABLET | Freq: Once | ORAL | Status: AC
  Administered 2012-12-26: 7.5 mg via ORAL
  Filled 2012-12-26: qty 1

## 2012-12-26 MED ORDER — FUROSEMIDE 10 MG/ML IJ SOLN
40.0000 mg | Freq: Once | INTRAMUSCULAR | Status: AC
  Administered 2012-12-26: 40 mg via INTRAVENOUS
  Filled 2012-12-26: qty 4

## 2012-12-26 MED ORDER — ENOXAPARIN SODIUM 100 MG/ML ~~LOC~~ SOLN
1.0000 mg/kg | Freq: Once | SUBCUTANEOUS | Status: AC
  Administered 2012-12-26: 90 mg via SUBCUTANEOUS
  Filled 2012-12-26: qty 1

## 2012-12-26 MED ORDER — ALBUTEROL SULFATE (5 MG/ML) 0.5% IN NEBU: 2.5000 mg | INHALATION_SOLUTION | RESPIRATORY_TRACT | Status: DC | PRN

## 2012-12-26 MED ORDER — ENOXAPARIN SODIUM 100 MG/ML ~~LOC~~ SOLN
1.0000 mg/kg | Freq: Two times a day (BID) | SUBCUTANEOUS | Status: DC
  Administered 2012-12-27 – 2012-12-30 (×7): 85 mg via SUBCUTANEOUS
  Filled 2012-12-26 (×7): qty 1

## 2012-12-26 MED ORDER — SENNOSIDES-DOCUSATE SODIUM 8.6-50 MG PO TABS: 1.0000 | ORAL_TABLET | Freq: Every evening | ORAL | Status: DC | PRN

## 2012-12-26 MED ORDER — WARFARIN - PHARMACIST DOSING INPATIENT: Freq: Every day | Status: DC

## 2012-12-26 MED ORDER — ONDANSETRON HCL 4 MG/2ML IJ SOLN: 4.0000 mg | Freq: Four times a day (QID) | INTRAMUSCULAR | Status: DC | PRN

## 2012-12-26 MED ORDER — ACETAMINOPHEN 325 MG PO TABS: 650.0000 mg | ORAL_TABLET | Freq: Four times a day (QID) | ORAL | Status: DC | PRN

## 2012-12-26 MED ORDER — METOPROLOL SUCCINATE ER 50 MG PO TB24
50.0000 mg | ORAL_TABLET | Freq: Every day | ORAL | Status: DC
  Administered 2012-12-26 – 2012-12-30 (×5): 50 mg via ORAL
  Filled 2012-12-26 (×5): qty 1

## 2012-12-26 MED ORDER — SODIUM CHLORIDE 0.9 % IJ SOLN
3.0000 mL | Freq: Two times a day (BID) | INTRAMUSCULAR | Status: DC
  Administered 2012-12-27 – 2012-12-28 (×3): 3 mL via INTRAVENOUS
  Filled 2012-12-26: qty 3

## 2012-12-26 MED ORDER — ZOLPIDEM TARTRATE 5 MG PO TABS: 5.0000 mg | ORAL_TABLET | Freq: Every evening | ORAL | Status: DC | PRN

## 2012-12-26 MED ORDER — SODIUM CHLORIDE 0.9 % IV SOLN: INTRAVENOUS | Status: AC

## 2012-12-26 MED ORDER — ALUM & MAG HYDROXIDE-SIMETH 200-200-20 MG/5ML PO SUSP: 30.0000 mL | Freq: Four times a day (QID) | ORAL | Status: DC | PRN

## 2012-12-26 MED ORDER — ACETAMINOPHEN 650 MG RE SUPP: 650.0000 mg | Freq: Four times a day (QID) | RECTAL | Status: DC | PRN

## 2012-12-26 MED ORDER — FUROSEMIDE 20 MG PO TABS
20.0000 mg | ORAL_TABLET | Freq: Every day | ORAL | Status: DC
  Administered 2012-12-27 – 2012-12-30 (×4): 20 mg via ORAL
  Filled 2012-12-26 (×4): qty 1

## 2012-12-26 MED ORDER — SODIUM CHLORIDE 0.9 % IV SOLN
INTRAVENOUS | Status: DC
  Administered 2012-12-26 – 2012-12-29 (×6): via INTRAVENOUS

## 2012-12-26 MED ORDER — ONDANSETRON HCL 4 MG PO TABS: 4.0000 mg | ORAL_TABLET | Freq: Four times a day (QID) | ORAL | Status: DC | PRN

## 2012-12-26 NOTE — Progress Notes (Signed)
ANTICOAGULATION CONSULT NOTE  Pharmacy Consult for Warfrin Indication: pulmonary embolus Hx  No Known Allergies  Patient Measurements: Height: 5\' 11"  (180.3 cm) Weight: 184 lb 15.5 oz (83.9 kg) IBW/kg (Calculated) : 75.3   Vital Signs: Temp: 97.7 F (36.5 C) (01/17 2043) Temp src: Oral (01/17 2043) BP: 132/82 mmHg (01/17 2043) Pulse Rate: 103  (01/17 2043)  Labs:  Basename 12/26/12 1615  HGB 14.0  HCT 43.2  PLT 192  APTT --  LABPROT 18.8*  INR 1.63*  HEPARINUNFRC --  CREATININE 1.23  CKTOTAL --  CKMB --  TROPONINI <0.30    Estimated Creatinine Clearance: 58.7 ml/min (by C-G formula based on Cr of 1.23).   Medical History: Past Medical History  Diagnosis Date  . PE (pulmonary thromboembolism) 2003 or 2004  . Hypertension   . GERD (gastroesophageal reflux disease)   . Bilateral chronic knee pain DJD  . 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 Allergic reaction 06/17/2012    Possibly from chili at [facility]  . Tongue swelling 06/16/2012    Possibly from chili at [facility]   Medications:  Prescriptions prior to admission  Medication Sig Dispense Refill  . diclofenac sodium (VOLTAREN) 1 % GEL Apply 1 application topically 3 (three) times daily as needed (For arthritis pain.).  100 g  0  . metoprolol (LOPRESSOR) 50 MG tablet Take 50 mg by mouth 2 (two) times daily.      Marland Kitchen omeprazole (PRILOSEC) 20 MG capsule Take 20 mg by mouth daily.      Marland Kitchen warfarin (COUMADIN) 1 MG tablet Take 1 mg by mouth daily. Takes 6MG  TOTAL on Tuesdays, Thursdays, Saturdays, and Sundays. (5mg  tablet and 1mg  tablet)      . warfarin (COUMADIN) 5 MG tablet Take 5 mg by mouth daily. *Takes 5mg  TOTAL on Mondays, Wednesdays, and Fridays*      . furosemide (LASIX) 20 MG tablet Take 20 mg by mouth 3 (three) times a week. Takes on Mon, Wed, and Fri.        Assessment: Okay for Protocol INR below goal, has not taken Warfarin today per home med list. On  treatment dose Lovenox per MD, CBC monitoring ordered.  Goal of Therapy:  INR 2-3 Monitor platelets by anticoagulation protocol: Yes   Plan:  Lovenox treatment dose per MD. Warfarin 7.5mg  PO x 1 tonight. Daily PT/INR.  Allen Marshall 12/26/2012,8:48 PM

## 2012-12-26 NOTE — ED Notes (Signed)
Sob for 3 weeks, increasing sob.  Hx of PE , takes coumadin.

## 2012-12-26 NOTE — ED Notes (Signed)
Pt states he has recently had cold symptoms but denies cough. States he had became progressively worse over the past 3 weeks. NAD at this time.

## 2012-12-26 NOTE — H&P (Signed)
[facility] History and Physical  Darvin Dials WGN:[id number] DOB: 03-11-1941 DOA: 12/26/2012  Referring physician: EDP Zammit PCP: Dwana Melena, MD  Specialists: none  Chief Complaint: Dyspnea with exertion  HPI: Allen Marshall is a 72 y.o. male with a past medical history significant for pulmonary embolism and DVT status post vein stripping about 10 years ago who has been on chronic anticoagulation therapy since that time with what appears to be variable medication adherence. He has low health literacy, did not know what an INR was, but knew how his blood was checked and whether it was too thick or too thin. His previous healthcare was in [place] at [facility] [place] and he has recently established care with Dr. Margo Aye who manages his INR locally now. He came into the ED because his SOB had gotten so bad he could not get out of his bed without significant distress. The dyspnea started about 3 weeks ago by his best estimate, but tells me that he "felt really good back in the summer". He has noticed worsening LE edmea especially in his right leg which previously had vein surgery and the DVT. He denies any chest pain, fever, chills, NVD. No syncope or neurological complaints or focal weakness. No history of malignancy or clotting disorders.  CT in ED shows very large clot burden PE with near complete occlusion of some of the segmental branches on the right and left as well as right heart strain.  Review of Systems: Review of Systems  Constitutional: Positive for malaise/fatigue. Negative for fever, chills, weight loss and diaphoresis.  HENT: Negative for congestion and sore throat.   Eyes: Negative.   Respiratory: Positive for cough, shortness of breath and wheezing.   Cardiovascular: Positive for orthopnea and leg swelling.  Gastrointestinal: Positive for heartburn. Negative for nausea, vomiting, abdominal pain, diarrhea and constipation.  Genitourinary: Negative.   Musculoskeletal: Negative.   Skin:  Negative for itching and rash.  Neurological: Positive for weakness. Negative for dizziness, tremors, sensory change, speech change, focal weakness, seizures, loss of consciousness and headaches.  Endo/Heme/Allergies: Negative.   Psychiatric/Behavioral: Negative.   All other systems reviewed and are negative.   Past Medical History  Diagnosis Date  . PE (pulmonary thromboembolism) 2003 or 2004  . Hypertension   . GERD (gastroesophageal reflux disease)   . Bilateral chronic knee pain DJD  . 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 Allergic reaction 06/17/2012    Possibly from chili at [facility]  . Tongue swelling 06/16/2012    Possibly from chili at [facility]   Past Surgical History  Procedure Date  . Varicose vein surgery 1970s   Social History:  reports that he has quit smoking. He does not have any smokeless tobacco history on file. He reports that he does not drink alcohol or use illicit drugs. Lives alone. Sister is at bedside and is his primary support. Retired.  No Known Allergies  History reviewed. No pertinent family history. No history of coagulation disorders.  Prior to Admission medications   Medication Sig Start Date End Date Taking? Authorizing Provider  diclofenac sodium (VOLTAREN) 1 % GEL Apply 1 application topically 3 (three) times daily as needed (For arthritis pain.). 06/17/12  Yes Elliot Cousin, MD  metoprolol (LOPRESSOR) 50 MG tablet Take 50 mg by mouth 2 (two) times daily.   Yes Historical Provider, MD  omeprazole (PRILOSEC) 20 MG capsule Take 20 mg by mouth daily.   Yes Historical Provider, MD  warfarin (COUMADIN) 1 MG tablet Take 1 mg by mouth daily. Takes 6MG  TOTAL on Tuesdays, Thursdays, Saturdays, and Sundays. (5mg  tablet and 1mg  tablet)   Yes Historical Provider, MD  warfarin (COUMADIN) 5 MG tablet Take 5 mg by mouth daily. *Takes 5mg  TOTAL on Mondays, Wednesdays, and Fridays*   Yes Historical Provider, MD  furosemide  (LASIX) 20 MG tablet Take 20 mg by mouth 3 (three) times a week. Takes on Mon, Wed, and Fri.    Historical Provider, MD   Physical Exam: Filed Vitals:   12/26/12 1143 12/26/12 1719 12/26/12 1804  BP:  145/94 154/85  Pulse: 97 67 86  Temp: 97.4 F (36.3 C)    TempSrc: Oral    Resp: 20 22 16   Height: 5\' 11"  (1.803 m)    Weight: 90.719 kg (200 lb)    SpO2: 100% 97% 98%     General:  Alert, awake, making jokes, ready to go except he can breathe when he tries  Eyes: PERRL  ENT: poor dentition, moist MM  Neck: JVD+, no adenopathy  Cardiovascular: Irregular, tachy no mrg  Respiratory: Decreased breath sounds in bilateral bases, scattered wheezes  Abdomen: soft NT  Skin: right leg is swollen, appears chronic, cool dry, stasis changes-left leg is soft, non tender w/o edema  Musculoskeletal: moves all extremities, no atrophy  Psychiatric: appropriate  Neurologic: non-focal exam  Labs on Admission:  Basic Metabolic Panel:  Lab 12/26/12 1610  NA 142  K 4.7  CL 106  CO2 26  GLUCOSE 105*  BUN 19  CREATININE 1.23  CALCIUM 9.8  MG --  PHOS --   Liver Function Tests:  Lab 12/26/12 1615  AST 15  ALT 9  ALKPHOS 67  BILITOT 1.1  PROT 7.1  ALBUMIN 4.2  CBC:  Lab 12/26/12 1615  WBC 4.9  NEUTROABS 2.4  HGB 14.0  HCT 43.2  MCV 93.9  PLT 192   Cardiac Enzymes:  Lab 12/26/12 1615  CKTOTAL --  CKMB --  CKMBINDEX --  TROPONINI <0.30    BNP (last 3 results)  Basename 12/26/12 1615  PROBNP 2253.0*    Radiological Exams on Admission: Dg Chest 2 View  12/26/2012  *RADIOLOGY REPORT*  Clinical Data: Shortness of breath.  CHEST - 2 VIEW  Comparison: No priors.  Findings: Mild blunting of both costophrenic sulci may reflect some chronic pleuroparenchymal scarring or trace bilateral pleural effusions. Linear opacity projecting over the right lower lobe favored to represent subsegmental atelectasis and/or chronic scarring, although an area of early airspace  consolidation is difficult to entirely exclude.  Lungs are otherwise clear.  No evidence of pulmonary edema.  Heart size is normal.  Mediastinal contours are unremarkable.  Atherosclerosis in the thoracic aorta.  IMPRESSION: 1.  Right lower lobe opacity is linear in appearance favored to represent an area of subsegmental atelectasis and/or scarring, however, underlying early airspace consolidation is difficult to entirely exclude.  Clinical correlation is recommended. 2.  Atherosclerosis. 3.  Chronic pleuroparenchymal scarring versus trace bilateral pleural effusions.   Original Report Authenticated By: Trudie Reed, M.D.    Ct Angio Chest Pe W/cm &/or Wo Cm  12/26/2012  *RADIOLOGY REPORT*  Clinical Data: Shortness of breath.  The patient has a history of pulmonary embolism.  CT ANGIOGRAPHY CHEST  Technique:  Multidetector CT imaging of the chest using the standard protocol during bolus administration of intravenous contrast. Multiplanar reconstructed images including MIPs were obtained and reviewed to evaluate the vascular anatomy.  Contrast: OMNIPAQUE  IOHEXOL 350 MG/ML SOLN  Comparison: Chest radiograph 12/26/2012  Findings: This is a technically good evaluation of the pulmonary arterial tree.  This exam is positive for pulmonary emboli.  There is clot within the distal right main pulmonary artery, extending into the lobar, segmental and subsegmental branches of the right middle lobe.  Clot is seen within all segmental branches to the right lower lobe, and into multiple subsegmental branches. Some of the branches to the right lower lobe appear completely occluded. Clot is present in the lobar and segmental branches to the right upper lobe.  Clot is present in segmental and subsegmental branches to the left lower lobe.  Probable clot within a segmental branch to the left upper lobe on image number 37.  Mild cardiomegaly.  There is straightening of the interventricular septum, for which right heart  strain is suspected.  There is reflux of contrast into the inferior vena cava and hepatic veins suggesting elevated right heart pressures.  There is an enlarged prevascular lymph node measuring 17 x 11 mm. No additional enlarged mediastinal lymph nodes are detected.  There is no supraclavicular or axillary lymphadenopathy.  No hilar lymphadenopathy.  Negative for pleural or pericardial effusion.  There is some respiratory motion affecting the upper lungs on the lung windows.  Both lungs are well expanded.  The lungs are clear. There is no airspace disease, pulmonary mass or nodule, or an interstitial abnormality.  Minimal atelectasis in the dependent portions bilaterally.  Thoracic spine vertebral bodies are normal in height and alignment. Mild convex right curvature of the thoracic spine.  No acute bony abnormality.  IMPRESSION: 1. Positive for bilateral pulmonary emboli. There is a large clot burden in the right lower lobe; with a mild to moderate amount of clot in the left lower lobe, right upper lobe, and right middle lobe with possible small clot in the left upper lobe. There is evidence of right heart strain/elevated right heart pressures. Critical Value/emergent results were called by telephone at the time of interpretation on 12/26/2012 at 5:55 p.m. to Dr. Estell Harpin, who verbally acknowledged these results.                        2. The lungs are clear. 3.  Mild cardiomegaly. 4.  Single enlarged prevascular lymph node of uncertain etiology or chronicity.   Original Report Authenticated By: Britta Mccreedy, M.D.     EKG: Independently reviewed. A-Fib, rate 90s.  Assessment/Plan Pulmonary embolism Right heart failure, Pulm HTN Severe Exertional Dyspnea  Chronic anticoagulation HTN (hypertension) A-fib (atrial fibrillation)   1. PE, acute on chronic possible, history of PE 10 years ago has been on coumadin-difficult to sort out history due to low health literacy-he improved on warfarin-dyspnea started  to get much worse about 3 weeks ago-his INRs have been difficult to keep in range recently. Today he is subtherapeutic at 1.6. CT with concerning large PE and right heart strain. His blood pressure has been in a normal range but he is known to have malignant hypertension.  Lovenox bridge until therapuetic on Warfarin, difficult to know if this is warfarin failure or related to sub-therapeutic INR, need to obtain outpatient records and possibly records from [place].  Very large clot burden with significant exertional dyspnea and evidence of heart failure, may need vascular/IR evaluation-will check dopplers of his right leg if large chronic DVT present may need to consider filter. Right heart strain with CHF and A-Fib is relatively high risk for acute  decompensation. Check 2D echo. 2. A-Fib, on coumadin. Rate controlled on B-Blocker. Unclear if this is new or old- no records available. 3. Right Heart Strain, Pulmonary HTN with elevated BNP, BP is normal-high in setting of both A-Fib and significant PE but may reflect the severity of his hypertension at baseline. Resumed his Metoprolol. Gave one dose of Lasix in ED. Unclear if this is acute from PE or Chronic. Continuous O2 ordered. Will check ambulatory O2 sats he may need O2 on discharge until clot burden improves.   Disposition Plan: Home when medically stable. Time spent: 70 minutes  [facility] Pager [phone]  If 7PM-7AM, please contact night-coverage [email or web address] Password [email or web address] 12/26/2012, 8:11 PM

## 2012-12-26 NOTE — ED Notes (Signed)
Report attempted.  Nurse to call back.

## 2012-12-26 NOTE — ED Provider Notes (Signed)
History  This chart was scribed for Allen Lennert, MD by Erskine Emery, ED Scribe. This patient was seen in room APA03/APA03 and the patient's care was started at 16:03.   CSN: [id number]  Arrival date & time 12/26/12  1137   First MD Initiated Contact with Patient 12/26/12 1603      Chief Complaint  Patient presents with  . Shortness of Breath    (Consider location/radiation/quality/duration/timing/severity/associated sxs/prior Treatment) Allen Marshall is a 72 y.o. male who presents to the Emergency Department complaining of gradually worsening SOB for the past 2-3 weeks.  Patient is a 72 y.o. male presenting with shortness of breath. The history is provided by the patient. No language interpreter was used.  Shortness of Breath  The current episode started more than 2 weeks ago. The onset was gradual. The problem occurs frequently. The problem has been gradually worsening. The problem is mild. Nothing relieves the symptoms. The symptoms are aggravated by activity. Associated symptoms include shortness of breath. Pertinent negatives include no chest pain, no fever, no rhinorrhea, no sore throat and no cough. There was no intake of a foreign body. The Heimlich maneuver was not attempted. He was not exposed to toxic fumes. He has inhaled smoke recently. He has had no prior steroid use. He has had prior hospitalizations. He has had no prior ICU admissions. He has had no prior intubations. His past medical history does not include asthma, eczema or asthma in the family. Past medical history comments: pulmonary thromboembolism. He has been behaving normally. Urine output has been normal. The last void occurred less than 6 hours ago. There were no sick contacts. Recently, medical care has been given by the PCP. Services received include medications given.  Pt reports some associated sneezing but denies any coughing, fevers, or chills. Pt reports his SOB is significantly aggravated by ambulation and  activity. Pt reports he has not taken his blood thinner (Coumadin) today, he usually takes it in the afternoon. He also recently reports it was switched from 5mg  to 6mg . He has taken one dose of his medication for his h/o HTN, which he takes every morning and afternoon. Pt has a h/o pulmonary thromboembolism in 2003 or 2004. He states that episode felt simiilar to this episode but much worse. Pt also has a h/o bypass surgery and varicose vein surgery in 1970.  Dr. [name] is the pt's PCP who cannot see him for another few weeks.  Past Medical History  Diagnosis Date  . PE (pulmonary thromboembolism) 2003 or 2004  . Hypertension   . GERD (gastroesophageal reflux disease)   . Bilateral chronic knee pain DJD  . 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 Allergic reaction 06/17/2012    Possibly from chili at [facility]  . Tongue swelling 06/16/2012    Possibly from chili at [facility]    Past Surgical History  Procedure Date  . Varicose vein surgery 1970s    History reviewed. No pertinent family history.  History  Substance Use Topics  . Smoking status: Former Games developer  . Smokeless tobacco: Not on file  . Alcohol Use: No      Review of Systems  Constitutional: Negative for fever, chills and fatigue.  HENT: Positive for sneezing. Negative for congestion, sore throat, rhinorrhea, sinus pressure and ear discharge.   Eyes: Negative for discharge.  Respiratory: Positive for shortness of breath. Negative for cough.   Cardiovascular: Negative for chest pain.  Gastrointestinal: Negative for abdominal pain and diarrhea.  Genitourinary: Negative for frequency and hematuria.  Musculoskeletal: Negative for back pain.  Skin: Negative for rash.  Neurological: Negative for seizures and headaches.  Hematological: Negative.   Psychiatric/Behavioral: Negative for hallucinations.  All other systems reviewed and are negative.    Allergies  Review of  patient's allergies indicates no known allergies.  Home Medications   Current Outpatient Rx  Name  Route  Sig  Dispense  Refill  . ACETAMINOPHEN 500 MG PO TABS   Oral   Take 1,000 mg by mouth every 6 (six) hours as needed. Pain         . VITAMIN B-12 ER 2000 MCG PO TBCR   Oral   Take 2,000 mcg by mouth daily.         Marland Kitchen DICLOFENAC SODIUM 1 % TD GEL   Topical   Apply 1 application topically 3 (three) times daily as needed (For arthritis pain.).   100 g   0   . DIPHENHYDRAMINE HCL 25 MG PO TABS   Oral   Take 1 tablet (25 mg total) by mouth 2 (two) times daily before a meal. Take for 4 more days.   30 tablet   0   . FAMOTIDINE 10 MG PO CHEW   Oral   Chew 2 tablets (20 mg total) by mouth 2 (two) times daily. Take for more 4 days.         . FUROSEMIDE 20 MG PO TABS   Oral   Take 20 mg by mouth 3 (three) times a week. Takes on Mon, Wed, and Fri.         Marland Kitchen HYDROCODONE-ACETAMINOPHEN 5-325 MG PO TABS   Oral   Take 1 tablet by mouth 2 (two) times daily. Pain         . METOPROLOL TARTRATE 50 MG PO TABS   Oral   Take 50 mg by mouth 2 (two) times daily.         Marland Kitchen OMEPRAZOLE 20 MG PO CPDR   Oral   Take 20 mg by mouth daily.         . WARFARIN SODIUM 1 MG PO TABS   Oral   Take 1 mg by mouth 3 (three) times a week. Takes on Mon, Wed, and Fri along with the 5 mg tablet to make 6 mg on these days.         . WARFARIN SODIUM 5 MG PO TABS   Oral   Take 5 mg by mouth daily.           Triage Vitals: Pulse 97  Temp 97.4 F (36.3 C) (Oral)  Resp 20  Ht 5\' 11"  (1.803 m)  Wt 200 lb (90.719 kg)  BMI 27.89 kg/m2  SpO2 100%  Physical Exam  Nursing note and vitals reviewed. Constitutional: He is oriented to person, place, and time. He appears well-developed.  HENT:  Head: Normocephalic and atraumatic.  Eyes: Conjunctivae normal and EOM are normal. No scleral icterus.  Neck: Neck supple. No thyromegaly present.  Cardiovascular: Normal rate and regular rhythm.   Exam reveals no gallop and no friction rub.   No murmur heard. Pulmonary/Chest: No stridor. He has no wheezes. He has no rales. He exhibits no tenderness.  Abdominal: He exhibits no distension. There is no tenderness. There is no rebound.  Musculoskeletal: Normal range of motion. He exhibits edema.       Mild to moderate swelling in right leg.  Lymphadenopathy:  He has no cervical adenopathy.  Neurological: He is oriented to person, place, and time. Coordination normal.  Skin: No rash noted. No erythema.  Psychiatric: He has a normal mood and affect. His behavior is normal.    ED Course  Procedures (including critical care time) DIAGNOSTIC STUDIES: Oxygen Saturation is 100% on room air, normal by my interpretation.    COORDINATION OF CARE: 16:10--I evaluated the patient and we discussed a treatment plan including x-ray and blood work to which the pt agreed.    Results for orders placed during the hospital encounter of 12/26/12  CBC WITH DIFFERENTIAL      Component Value Range   WBC 4.9  4.0 - 10.5 K/uL   RBC 4.60  4.22 - 5.81 MIL/uL   Hemoglobin 14.0  13.0 - 17.0 g/dL   HCT 46.9  62.9 - 52.8 %   MCV 93.9  78.0 - 100.0 fL   MCH 30.4  26.0 - 34.0 pg   MCHC 32.4  30.0 - 36.0 g/dL   RDW 41.3  24.4 - 01.0 %   Platelets 192  150 - 400 K/uL   Neutrophils Relative 49  43 - 77 %   Neutro Abs 2.4  1.7 - 7.7 K/uL   Band Neutrophils 0  0 - 10 %   Lymphocytes Relative 42  12 - 46 %   Lymphs Abs 2.1  0.7 - 4.0 K/uL   Monocytes Relative 8  3 - 12 %   Monocytes Absolute 0.4  0.1 - 1.0 K/uL   Eosinophils Relative 1  0 - 5 %   Eosinophils Absolute 0.1  0.0 - 0.7 K/uL   Basophils Relative 1  0 - 1 %   Basophils Absolute 0.1  0.0 - 0.1 K/uL  COMPREHENSIVE METABOLIC PANEL      Component Value Range   Sodium 142  135 - 145 mEq/L   Potassium 4.7  3.5 - 5.1 mEq/L   Chloride 106  96 - 112 mEq/L   CO2 26  19 - 32 mEq/L   Glucose, Bld 105 (*) 70 - 99 mg/dL   BUN 19  6 - 23 mg/dL    Creatinine, Ser 2.72  0.50 - 1.35 mg/dL   Calcium 9.8  8.4 - 53.6 mg/dL   Total Protein 7.1  6.0 - 8.3 g/dL   Albumin 4.2  3.5 - 5.2 g/dL   AST 15  0 - 37 U/L   ALT 9  0 - 53 U/L   Alkaline Phosphatase 67  39 - 117 U/L   Total Bilirubin 1.1  0.3 - 1.2 mg/dL   GFR calc non Af Amer 57 (*) >90 mL/min   GFR calc Af Amer 66 (*) >90 mL/min  PROTIME-INR      Component Value Range   Prothrombin Time 18.8 (*) 11.6 - 15.2 seconds   INR 1.63 (*) 0.00 - 1.49  D-DIMER, QUANTITATIVE      Component Value Range   D-Dimer, Quant 9.77 (*) 0.00 - 0.48 ug/mL-FEU  TROPONIN I      Component Value Range   Troponin I <0.30  <0.30 ng/mL  PRO B NATRIURETIC PEPTIDE      Component Value Range   Pro B Natriuretic peptide (BNP) 2253.0 (*) 0 - 125 pg/mL   Dg Chest 2 View  12/26/2012  *RADIOLOGY REPORT*  Clinical Data: Shortness of breath.  CHEST - 2 VIEW  Comparison: No priors.  Findings: Mild blunting of both costophrenic sulci may reflect some chronic  pleuroparenchymal scarring or trace bilateral pleural effusions. Linear opacity projecting over the right lower lobe favored to represent subsegmental atelectasis and/or chronic scarring, although an area of early airspace consolidation is difficult to entirely exclude.  Lungs are otherwise clear.  No evidence of pulmonary edema.  Heart size is normal.  Mediastinal contours are unremarkable.  Atherosclerosis in the thoracic aorta.  IMPRESSION: 1.  Right lower lobe opacity is linear in appearance favored to represent an area of subsegmental atelectasis and/or scarring, however, underlying early airspace consolidation is difficult to entirely exclude.  Clinical correlation is recommended. 2.  Atherosclerosis. 3.  Chronic pleuroparenchymal scarring versus trace bilateral pleural effusions.   Original Report Authenticated By: Trudie Reed, M.D.    Ct Angio Chest Pe W/cm &/or Wo Cm  12/26/2012  *RADIOLOGY REPORT*  Clinical Data: Shortness of breath.  The patient has a  history of pulmonary embolism.  CT ANGIOGRAPHY CHEST  Technique:  Multidetector CT imaging of the chest using the standard protocol during bolus administration of intravenous contrast. Multiplanar reconstructed images including MIPs were obtained and reviewed to evaluate the vascular anatomy.  Contrast: OMNIPAQUE IOHEXOL 350 MG/ML SOLN  Comparison: Chest radiograph 12/26/2012  Findings: This is a technically good evaluation of the pulmonary arterial tree.  This exam is positive for pulmonary emboli.  There is clot within the distal right main pulmonary artery, extending into the lobar, segmental and subsegmental branches of the right middle lobe.  Clot is seen within all segmental branches to the right lower lobe, and into multiple subsegmental branches. Some of the branches to the right lower lobe appear completely occluded. Clot is present in the lobar and segmental branches to the right upper lobe.  Clot is present in segmental and subsegmental branches to the left lower lobe.  Probable clot within a segmental branch to the left upper lobe on image number 37.  Mild cardiomegaly.  There is straightening of the interventricular septum, for which right heart strain is suspected.  There is reflux of contrast into the inferior vena cava and hepatic veins suggesting elevated right heart pressures.  There is an enlarged prevascular lymph node measuring 17 x 11 mm. No additional enlarged mediastinal lymph nodes are detected.  There is no supraclavicular or axillary lymphadenopathy.  No hilar lymphadenopathy.  Negative for pleural or pericardial effusion.  There is some respiratory motion affecting the upper lungs on the lung windows.  Both lungs are well expanded.  The lungs are clear. There is no airspace disease, pulmonary mass or nodule, or an interstitial abnormality.  Minimal atelectasis in the dependent portions bilaterally.  Thoracic spine vertebral bodies are normal in height and alignment. Mild convex  right curvature of the thoracic spine.  No acute bony abnormality.  IMPRESSION: 1. Positive for bilateral pulmonary emboli. There is a large clot burden in the right lower lobe; with a mild to moderate amount of clot in the left lower lobe, right upper lobe, and right middle lobe with possible small clot in the left upper lobe. There is evidence of right heart strain/elevated right heart pressures. Critical Value/emergent results were called by telephone at the time of interpretation on 12/26/2012 at 5:55 p.m. to Dr. Estell Harpin, who verbally acknowledged these results.                        2. The lungs are clear. 3.  Mild cardiomegaly. 4.  Single enlarged prevascular lymph node of uncertain etiology or chronicity.   Original  Report Authenticated By: Britta Mccreedy, M.D.         No diagnosis found.   Date: 12/26/2012  Rate: 91  Rhythm: atrial fibrillation  QRS Axis: normal  Intervals: normal  ST/T Wave abnormalities: nonspecific ST changes  Conduction Disutrbances:none  Narrative Interpretation:   Old EKG Reviewed: none available    MDM        The chart was scribed for me under my direct supervision.  I personally performed the history, physical, and medical decision making and all procedures in the evaluation of this patient.Allen Lennert, MD 12/26/12 [id number]

## 2012-12-27 ENCOUNTER — Encounter (HOSPITAL_COMMUNITY): Payer: Self-pay | Admitting: *Deleted

## 2012-12-27 ENCOUNTER — Inpatient Hospital Stay (HOSPITAL_COMMUNITY): Payer: Medicare Other

## 2012-12-27 DIAGNOSIS — I10 Essential (primary) hypertension: Secondary | ICD-10-CM

## 2012-12-27 DIAGNOSIS — I2699 Other pulmonary embolism without acute cor pulmonale: Principal | ICD-10-CM

## 2012-12-27 DIAGNOSIS — Z7901 Long term (current) use of anticoagulants: Secondary | ICD-10-CM

## 2012-12-27 DIAGNOSIS — I4891 Unspecified atrial fibrillation: Secondary | ICD-10-CM

## 2012-12-27 LAB — CBC
HCT: 40.5 % (ref 39.0–52.0)
MCHC: 32.3 g/dL (ref 30.0–36.0)
MCV: 94.2 fL (ref 78.0–100.0)
RDW: 13.1 % (ref 11.5–15.5)

## 2012-12-27 LAB — BASIC METABOLIC PANEL
Calcium: 8.9 mg/dL (ref 8.4–10.5)
GFR calc non Af Amer: 45 mL/min — ABNORMAL LOW (ref >90)
Glucose, Bld: 107 mg/dL — ABNORMAL HIGH (ref 70–99)
Potassium: 3.7 mEq/L (ref 3.5–5.1)
Sodium: 142 mEq/L (ref 135–145)

## 2012-12-27 MED ORDER — METOPROLOL TARTRATE 25 MG PO TABS: 25.0000 mg | ORAL_TABLET | Freq: Once | ORAL | Status: DC

## 2012-12-27 MED ORDER — WARFARIN SODIUM 7.5 MG PO TABS
7.5000 mg | ORAL_TABLET | Freq: Once | ORAL | Status: AC
  Administered 2012-12-27: 7.5 mg via ORAL
  Filled 2012-12-27: qty 1

## 2012-12-27 MED ORDER — WARFARIN VIDEO
Freq: Once | Status: AC
  Administered 2012-12-27: 12:00:00

## 2012-12-27 MED ORDER — COUMADIN BOOK
Freq: Once | Status: AC
  Administered 2012-12-27: 12:00:00
  Filled 2012-12-27: qty 1

## 2012-12-27 NOTE — Progress Notes (Signed)
ANTICOAGULATION CONSULT NOTE  Pharmacy Consult for Warfrin Indication: pulmonary embolus Hx  No Known Allergies  Patient Measurements: Height: 5\' 11"  (180.3 cm) Weight: 188 lb 7.9 oz (85.5 kg) IBW/kg (Calculated) : 75.3   Vital Signs: Temp: 98.1 F (36.7 C) (01/18 0341) Temp src: Oral (01/18 0341) BP: 110/73 mmHg (01/18 0341) Pulse Rate: 95  (01/18 0432)  Labs:  Basename 12/27/12 0547 12/26/12 1615  HGB 13.1 14.0  HCT 40.5 43.2  PLT 190 192  APTT -- --  LABPROT 19.4* 18.8*  INR 1.70* 1.63*  HEPARINUNFRC -- --  CREATININE 1.50* 1.23  CKTOTAL -- --  CKMB -- --  TROPONINI -- <0.30    Estimated Creatinine Clearance: 48.1 ml/min (by C-G formula based on Cr of 1.5).   Medical History: Past Medical History  Diagnosis Date  . PE (pulmonary thromboembolism) 2003 or 2004  . Hypertension   . GERD (gastroesophageal reflux disease)   . Bilateral chronic knee pain DJD  . 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 Allergic reaction 06/17/2012    Possibly from chili at [facility]  . Tongue swelling 06/16/2012    Possibly from chili at [facility]   Medications:  Prescriptions prior to admission  Medication Sig Dispense Refill  . diclofenac sodium (VOLTAREN) 1 % GEL Apply 1 application topically 3 (three) times daily as needed (For arthritis pain.).  100 g  0  . metoprolol (LOPRESSOR) 50 MG tablet Take 50 mg by mouth 2 (two) times daily.      Marland Kitchen omeprazole (PRILOSEC) 20 MG capsule Take 20 mg by mouth daily.      Marland Kitchen warfarin (COUMADIN) 1 MG tablet Take 1 mg by mouth daily. Takes 6MG  TOTAL on Tuesdays, Thursdays, Saturdays, and Sundays. (5mg  tablet and 1mg  tablet)      . warfarin (COUMADIN) 5 MG tablet Take 5 mg by mouth daily. *Takes 5mg  TOTAL on Mondays, Wednesdays, and Fridays*      . furosemide (LASIX) 20 MG tablet Take 20 mg by mouth 3 (three) times a week. Takes on Mon, Wed, and Fri.        Assessment: Okay for Protocol On treatment dose  Lovenox per MD, CBC monitoring ordered.  Goal of Therapy:  INR Goal = 2.5 per MD request. Monitor platelets by anticoagulation protocol: Yes   Plan:  Lovenox treatment dose per MD. Repeat Warfarin 7.5mg  PO x 1 today Daily PT/INR. Insurance underwriter.  Lamonte Richer R 12/27/2012,11:37 AM

## 2012-12-27 NOTE — Progress Notes (Signed)
Subjective: This man feels much better than when he came into the hospital yesterday. His dyspnea appears to have improved. He is requiring no supplemental oxygen to maintain saturations. He denies cough, chest pain or hemoptysis. He is being anticoagulated with Lovenox and warfarin. CT angiogram of the chest indicates a large clot burden, however clinically he is hemodynamically stable and has not required any supplemental oxygen.           Physical Exam: Blood pressure 110/73, pulse 95, temperature 98.1 F (36.7 C), temperature source Oral, resp. rate 20, height 5\' 11"  (1.803 m), weight 85.5 kg (188 lb 7.9 oz), SpO2 93.00%. Looks systemically well. Heart sounds are present and in atrial fibrillation. There is no peripheral pitting edema. Jugular venous pressure not raised. Lung fields are entirely clear with no evidence of pleural rub. He is alert and orientated. Abdomen is soft and nontender. There are no masses felt. His right leg is swollen, he says this is chronic.   Investigations:     Basic Metabolic Panel:  Basename 12/27/12 0547 12/26/12 1615  NA 142 142  K 3.7 4.7  CL 105 106  CO2 25 26  GLUCOSE 107* 105*  BUN 20 19  CREATININE 1.50* 1.23  CALCIUM 8.9 9.8  MG -- --  PHOS -- --   Liver Function Tests:  [facility] 12/26/12 1615  AST 15  ALT 9  ALKPHOS 67  BILITOT 1.1  PROT 7.1  ALBUMIN 4.2     CBC:  Basename 12/27/12 0547 12/26/12 1615  WBC 4.5 4.9  NEUTROABS -- 2.4  HGB 13.1 14.0  HCT 40.5 43.2  MCV 94.2 93.9  PLT 190 192    Dg Chest 2 View  12/26/2012  *RADIOLOGY REPORT*  Clinical Data: Shortness of breath.  CHEST - 2 VIEW  Comparison: No priors.  Findings: Mild blunting of both costophrenic sulci may reflect some chronic pleuroparenchymal scarring or trace bilateral pleural effusions. Linear opacity projecting over the right lower lobe favored to represent subsegmental atelectasis and/or chronic scarring, although an area of early airspace  consolidation is difficult to entirely exclude.  Lungs are otherwise clear.  No evidence of pulmonary edema.  Heart size is normal.  Mediastinal contours are unremarkable.  Atherosclerosis in the thoracic aorta.  IMPRESSION: 1.  Right lower lobe opacity is linear in appearance favored to represent an area of subsegmental atelectasis and/or scarring, however, underlying early airspace consolidation is difficult to entirely exclude.  Clinical correlation is recommended. 2.  Atherosclerosis. 3.  Chronic pleuroparenchymal scarring versus trace bilateral pleural effusions.   Original Report Authenticated By: Trudie Reed, M.D.    Ct Angio Chest Pe W/cm &/or Wo Cm  12/26/2012  *RADIOLOGY REPORT*  Clinical Data: Shortness of breath.  The patient has a history of pulmonary embolism.  CT ANGIOGRAPHY CHEST  Technique:  Multidetector CT imaging of the chest using the standard protocol during bolus administration of intravenous contrast. Multiplanar reconstructed images including MIPs were obtained and reviewed to evaluate the vascular anatomy.  Contrast: OMNIPAQUE IOHEXOL 350 MG/ML SOLN  Comparison: Chest radiograph 12/26/2012  Findings: This is a technically good evaluation of the pulmonary arterial tree.  This exam is positive for pulmonary emboli.  There is clot within the distal right main pulmonary artery, extending into the lobar, segmental and subsegmental branches of the right middle lobe.  Clot is seen within all segmental branches to the right lower lobe, and into multiple subsegmental branches. Some of the branches to the right  lower lobe appear completely occluded. Clot is present in the lobar and segmental branches to the right upper lobe.  Clot is present in segmental and subsegmental branches to the left lower lobe.  Probable clot within a segmental branch to the left upper lobe on image number 37.  Mild cardiomegaly.  There is straightening of the interventricular septum, for which right heart  strain is suspected.  There is reflux of contrast into the inferior vena cava and hepatic veins suggesting elevated right heart pressures.  There is an enlarged prevascular lymph node measuring 17 x 11 mm. No additional enlarged mediastinal lymph nodes are detected.  There is no supraclavicular or axillary lymphadenopathy.  No hilar lymphadenopathy.  Negative for pleural or pericardial effusion.  There is some respiratory motion affecting the upper lungs on the lung windows.  Both lungs are well expanded.  The lungs are clear. There is no airspace disease, pulmonary mass or nodule, or an interstitial abnormality.  Minimal atelectasis in the dependent portions bilaterally.  Thoracic spine vertebral bodies are normal in height and alignment. Mild convex right curvature of the thoracic spine.  No acute bony abnormality.  IMPRESSION: 1. Positive for bilateral pulmonary emboli. There is a large clot burden in the right lower lobe; with a mild to moderate amount of clot in the left lower lobe, right upper lobe, and right middle lobe with possible small clot in the left upper lobe. There is evidence of right heart strain/elevated right heart pressures. Critical Value/emergent results were called by telephone at the time of interpretation on 12/26/2012 at 5:55 p.m. to Dr. Estell Harpin, who verbally acknowledged these results.                        2. The lungs are clear. 3.  Mild cardiomegaly. 4.  Single enlarged prevascular lymph node of uncertain etiology or chronicity.   Original Report Authenticated By: Britta Mccreedy, M.D.       Medications: I have reviewed the patient's current medications.  Impression: 1. Bilateral multiple pulmonary emboli with large clot burden. Subtherapeutic INR when he was admitted. INR 1.7 this morning. 2. 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 3. Hypertension. 4. Atrial fibrillation, rate controlled. 5. Pulmonary hypertension but no clinical evidence of  right heart failure. 6. Increasing creatinine.     Plan: 1. Continue with anticoagulation. 2. Increase IV fluids. Monitor renal function closely.     LOS: 1 day   Wilson Singer Pager [phone]  12/27/2012, 8:46 AM

## 2012-12-27 NOTE — Progress Notes (Signed)
12/27/12 1730 Patient given coumadin booklet and watched coumadin video on patient education channel this afternoon. Reviewed coumadin education with patient, instructed to review booklet and notify nursing staff of any questions/concerns. Stated would notify of any concerns. Earnstine Regal, RN

## 2012-12-28 LAB — COMPREHENSIVE METABOLIC PANEL
ALT: 6 U/L (ref 0–53)
AST: 13 U/L (ref 0–37)
Albumin: 3.4 g/dL — ABNORMAL LOW (ref 3.5–5.2)
Alkaline Phosphatase: 53 U/L (ref 39–117)
Chloride: 107 mEq/L (ref 96–112)
Potassium: 3.5 mEq/L (ref 3.5–5.1)
Sodium: 141 mEq/L (ref 135–145)
Total Bilirubin: 0.9 mg/dL (ref 0.3–1.2)

## 2012-12-28 LAB — PROTIME-INR
INR: 1.78 — ABNORMAL HIGH (ref 0.00–1.49)
Prothrombin Time: 20.1 seconds — ABNORMAL HIGH (ref 11.6–15.2)

## 2012-12-28 LAB — GLUCOSE, CAPILLARY: Glucose-Capillary: 73 mg/dL (ref 70–99)

## 2012-12-28 MED ORDER — WARFARIN SODIUM 10 MG PO TABS
10.0000 mg | ORAL_TABLET | Freq: Once | ORAL | Status: AC
  Administered 2012-12-28: 10 mg via ORAL
  Filled 2012-12-28: qty 1

## 2012-12-28 NOTE — Progress Notes (Signed)
12/28/12 1800 Patient ambulated in hallway with nurse supervision, tolerated well. Assisted up to chair, call light within reach. Stated comfortable. Earnstine Regal, RN

## 2012-12-28 NOTE — Progress Notes (Addendum)
Subjective: This man feels much better than when he came into the hospital yesterday. He has not mobilized much.          Physical Exam: Blood pressure 117/76, pulse 85, temperature 98.1 F (36.7 C), temperature source Oral, resp. rate 16, height 5\' 11"  (1.803 m), weight 86.6 kg (190 lb 14.7 oz), SpO2 92.00%. Looks systemically well. Heart sounds are present and in atrial fibrillation. There is no peripheral pitting edema. Jugular venous pressure not raised. Lung fields are entirely clear with no evidence of pleural rub. He is alert and orientated. Abdomen is soft and nontender. There are no masses felt. His right leg is swollen, he says this is chronic.   Investigations:     Basic Metabolic Panel:  Basename 12/28/12 0636 12/27/12 0547  NA 141 142  K 3.5 3.7  CL 107 105  CO2 25 25  GLUCOSE 97 107*  BUN 19 20  CREATININE 1.21 1.50*  CALCIUM 8.7 8.9  MG -- --  PHOS -- --   Liver Function Tests:  [facility] 12/28/12 0636 12/26/12 1615  AST 13 15  ALT 6 9  ALKPHOS 53 67  BILITOT 0.9 1.1  PROT 5.9* 7.1  ALBUMIN 3.4* 4.2     CBC:  Basename 12/27/12 0547 12/26/12 1615  WBC 4.5 4.9  NEUTROABS -- 2.4  HGB 13.1 14.0  HCT 40.5 43.2  MCV 94.2 93.9  PLT 190 192    Dg Chest 2 View  12/26/2012  *RADIOLOGY REPORT*  Clinical Data: Shortness of breath.  CHEST - 2 VIEW  Comparison: No priors.  Findings: Mild blunting of both costophrenic sulci may reflect some chronic pleuroparenchymal scarring or trace bilateral pleural effusions. Linear opacity projecting over the right lower lobe favored to represent subsegmental atelectasis and/or chronic scarring, although an area of early airspace consolidation is difficult to entirely exclude.  Lungs are otherwise clear.  No evidence of pulmonary edema.  Heart size is normal.  Mediastinal contours are unremarkable.  Atherosclerosis in the thoracic aorta.  IMPRESSION: 1.  Right lower lobe opacity is linear in appearance favored to  represent an area of subsegmental atelectasis and/or scarring, however, underlying early airspace consolidation is difficult to entirely exclude.  Clinical correlation is recommended. 2.  Atherosclerosis. 3.  Chronic pleuroparenchymal scarring versus trace bilateral pleural effusions.   Original Report Authenticated By: Trudie Reed, M.D.    Ct Angio Chest Pe W/cm &/or Wo Cm  12/26/2012  *RADIOLOGY REPORT*  Clinical Data: Shortness of breath.  The patient has a history of pulmonary embolism.  CT ANGIOGRAPHY CHEST  Technique:  Multidetector CT imaging of the chest using the standard protocol during bolus administration of intravenous contrast. Multiplanar reconstructed images including MIPs were obtained and reviewed to evaluate the vascular anatomy.  Contrast: OMNIPAQUE IOHEXOL 350 MG/ML SOLN  Comparison: Chest radiograph 12/26/2012  Findings: This is a technically good evaluation of the pulmonary arterial tree.  This exam is positive for pulmonary emboli.  There is clot within the distal right main pulmonary artery, extending into the lobar, segmental and subsegmental branches of the right middle lobe.  Clot is seen within all segmental branches to the right lower lobe, and into multiple subsegmental branches. Some of the branches to the right lower lobe appear completely occluded. Clot is present in the lobar and segmental branches to the right upper lobe.  Clot is present in segmental and subsegmental branches to the left lower lobe.  Probable clot within a segmental branch to  the left upper lobe on image number 37.  Mild cardiomegaly.  There is straightening of the interventricular septum, for which right heart strain is suspected.  There is reflux of contrast into the inferior vena cava and hepatic veins suggesting elevated right heart pressures.  There is an enlarged prevascular lymph node measuring 17 x 11 mm. No additional enlarged mediastinal lymph nodes are detected.  There is no  supraclavicular or axillary lymphadenopathy.  No hilar lymphadenopathy.  Negative for pleural or pericardial effusion.  There is some respiratory motion affecting the upper lungs on the lung windows.  Both lungs are well expanded.  The lungs are clear. There is no airspace disease, pulmonary mass or nodule, or an interstitial abnormality.  Minimal atelectasis in the dependent portions bilaterally.  Thoracic spine vertebral bodies are normal in height and alignment. Mild convex right curvature of the thoracic spine.  No acute bony abnormality.  IMPRESSION: 1. Positive for bilateral pulmonary emboli. There is a large clot burden in the right lower lobe; with a mild to moderate amount of clot in the left lower lobe, right upper lobe, and right middle lobe with possible small clot in the left upper lobe. There is evidence of right heart strain/elevated right heart pressures. Critical Value/emergent results were called by telephone at the time of interpretation on 12/26/2012 at 5:55 p.m. to Dr. Estell Harpin, who verbally acknowledged these results.                        2. The lungs are clear. 3.  Mild cardiomegaly. 4.  Single enlarged prevascular lymph node of uncertain etiology or chronicity.   Original Report Authenticated By: Britta Mccreedy, M.D.    US Venous Img Lower Unilateral Right  12/27/2012  *RADIOLOGY REPORT*  Clinical Data: Pulmonary embolism, right lower extremity swelling, history of varicose vein stripping with the right leg, evaluate for DVT  RIGHT LOWER EXTREMITY VENOUS DUPLEX ULTRASOUND  Technique:  Gray-scale sonography with graded compression, as well as color Doppler and duplex ultrasound were performed to evaluate the deep venous system of the lower extremity from the level of the common femoral vein through the popliteal and proximal calf veins. Spectral Doppler was utilized to evaluate flow at rest and with distal augmentation maneuvers.  Comparison:  None.  Findings:  Normal compressibility of the  common femoral, superficial femoral, and popliteal veins is demonstrated, as well as the visualized proximal calf veins.  No filling defects to suggest DVT on grayscale or color Doppler imaging.  Doppler waveforms show normal direction of venous flow, normal respiratory phasicity and response to augmentation.  The proximal aspect of the right greater saphenous vein is narrowed and occluded.  Several varicosities are noted within the right calf.  IMPRESSION: 1.  No evidence of lower extremity deep vein thrombosis.  2.  The proximal aspect the right greater saphenous vein is narrowed and occluded, possibly the sequela of provided history of prior right lower extremity venous intervention.  Correlation with operative/sclerotherapy history is recommended.  Superficial thrombophlebitis cannot be excluded on the basis of this examination. 3.   Prominent varicosities noted within the calf suggestive of persistent venous insufficiency.   Original Report Authenticated By: Tacey Ruiz, MD       Medications: I have reviewed the patient's current medications.  Impression: 1. Bilateral multiple pulmonary emboli with large clot burden. Subtherapeutic INR when he was admitted. INR 1.78  this morning. 2. 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  3. Hypertension. 4. Atrial fibrillation, rate controlled. 5. Pulmonary hypertension but no clinical evidence of right heart failure.      Plan: 1. Continue with anticoagulation. 2. Mobilize.      LOS: 2 days   Wilson Singer Pager [phone]  12/28/2012, 8:06 AM

## 2012-12-28 NOTE — Progress Notes (Signed)
ANTICOAGULATION CONSULT NOTE  Pharmacy Consult for Warfrin Indication: pulmonary embolus Hx  No Known Allergies  Patient Measurements: Height: 5\' 11"  (180.3 cm) Weight: 190 lb 14.7 oz (86.6 kg) IBW/kg (Calculated) : 75.3   Vital Signs: Temp: 98.1 F (36.7 C) (01/19 0524) Temp src: Oral (01/19 0524) BP: 117/76 mmHg (01/19 0524) Pulse Rate: 85  (01/19 0524)  Labs:  Basename 12/28/12 0636 12/27/12 0547 12/26/12 1615  HGB -- 13.1 14.0  HCT -- 40.5 43.2  PLT -- 190 192  APTT -- -- --  LABPROT 20.1* 19.4* 18.8*  INR 1.78* 1.70* 1.63*  HEPARINUNFRC -- -- --  CREATININE 1.21 1.50* 1.23  CKTOTAL -- -- --  CKMB -- -- --  TROPONINI -- -- <0.30    Estimated Creatinine Clearance: 59.6 ml/min (by C-G formula based on Cr of 1.21).   Medical History: Past Medical History  Diagnosis Date  . PE (pulmonary thromboembolism) 2003 or 2004  . Hypertension   . GERD (gastroesophageal reflux disease)   . Bilateral chronic knee pain DJD  . 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 Allergic reaction 06/17/2012    Possibly from chili at [facility]  . Tongue swelling 06/16/2012    Possibly from chili at [facility]   Medications:  Prescriptions prior to admission  Medication Sig Dispense Refill  . diclofenac sodium (VOLTAREN) 1 % GEL Apply 1 application topically 3 (three) times daily as needed (For arthritis pain.).  100 g  0  . metoprolol (LOPRESSOR) 50 MG tablet Take 50 mg by mouth 2 (two) times daily.      Marland Kitchen omeprazole (PRILOSEC) 20 MG capsule Take 20 mg by mouth daily.      Marland Kitchen warfarin (COUMADIN) 1 MG tablet Take 1 mg by mouth daily. Takes 6MG  TOTAL on Tuesdays, Thursdays, Saturdays, and Sundays. (5mg  tablet and 1mg  tablet)      . warfarin (COUMADIN) 5 MG tablet Take 5 mg by mouth daily. *Takes 5mg  TOTAL on Mondays, Wednesdays, and Fridays*      . furosemide (LASIX) 20 MG tablet Take 20 mg by mouth 3 (three) times a week. Takes on Mon, Wed, and Fri.         Assessment: Okay for Protocol On treatment dose Lovenox per MD, CBC monitoring ordered.  Goal of Therapy:  INR Goal = 2.5 per MD request. Monitor platelets by anticoagulation protocol: Yes   Plan:  Lovenox treatment dose per MD. Warfarin 10mg  PO x 1 today Daily PT/INR. Education Completed.  Mady Gemma 12/28/2012,12:41 PM

## 2012-12-29 DIAGNOSIS — I517 Cardiomegaly: Secondary | ICD-10-CM

## 2012-12-29 LAB — CBC
Hemoglobin: 11.5 g/dL — ABNORMAL LOW (ref 13.0–17.0)
MCH: 30 pg (ref 26.0–34.0)
MCV: 94 fL (ref 78.0–100.0)
RBC: 3.83 MIL/uL — ABNORMAL LOW (ref 4.22–5.81)

## 2012-12-29 LAB — BASIC METABOLIC PANEL
BUN: 12 mg/dL (ref 6–23)
Chloride: 109 mEq/L (ref 96–112)
GFR calc Af Amer: 85 mL/min — ABNORMAL LOW (ref >90)
Potassium: 3.4 mEq/L — ABNORMAL LOW (ref 3.5–5.1)

## 2012-12-29 LAB — PROTIME-INR: Prothrombin Time: 20.8 seconds — ABNORMAL HIGH (ref 11.6–15.2)

## 2012-12-29 MED ORDER — WARFARIN SODIUM 10 MG PO TABS
10.0000 mg | ORAL_TABLET | Freq: Once | ORAL | Status: AC
  Administered 2012-12-29: 10 mg via ORAL
  Filled 2012-12-29: qty 1

## 2012-12-29 NOTE — Care Management Note (Unsigned)
    Page 1 of 1   12/29/2012     2:58:47 PM   CARE MANAGEMENT NOTE 12/29/2012  Patient:  Allen Marshall, Allen Marshall   Account Number:  [id number]  Date Initiated:  12/29/2012  Documentation initiated by:  Rosemary Holms  Subjective/Objective Assessment:   Pt admitted from home where he lives alone. Sister lives close to him and can assist as needed. States he drives and is very independent with ADL.     Action/Plan:   Anticipated DC Date:  12/30/2012   Anticipated DC Plan:  HOME/SELF CARE         Choice offered to / List presented to:             Status of service:  In process, will continue to follow Medicare Important Message given?   (If response is "NO", the following Medicare IM given date fields will be blank) Date Medicare IM given:   Date Additional Medicare IM given:    Discharge Disposition:    Per UR Regulation:    If discussed at Long Length of Stay Meetings, dates discussed:    Comments:  12/29/12 Rosemary Holms RN BSN CM Will follow to see if additional education for anticoagulation will be needed

## 2012-12-29 NOTE — Progress Notes (Signed)
ANTICOAGULATION CONSULT NOTE  Pharmacy Consult for Warfarin Indication: pulmonary embolus Hx  No Known Allergies  Patient Measurements: Height: 5\' 11"  (180.3 cm) Weight: 191 lb 12.8 oz (87 kg) IBW/kg (Calculated) : 75.3   Vital Signs: Temp: 97.9 F (36.6 C) (01/20 0450) Temp src: Oral (01/20 0450) BP: 155/91 mmHg (01/20 0450) Pulse Rate: 80  (01/20 0450)  Labs:  Basename 12/29/12 0340 12/28/12 0636 12/27/12 0547 12/26/12 1615  HGB 11.5* -- 13.1 --  HCT 36.0* -- 40.5 43.2  PLT 175 -- 190 192  APTT -- -- -- --  LABPROT 20.8* 20.1* 19.4* --  INR 1.87* 1.78* 1.70* --  HEPARINUNFRC -- -- -- --  CREATININE 1.00 1.21 1.50* --  CKTOTAL -- -- -- --  CKMB -- -- -- --  TROPONINI -- -- -- <0.30   Estimated Creatinine Clearance: 72.2 ml/min (by C-G formula based on Cr of 1).  Medical History: Past Medical History  Diagnosis Date  . PE (pulmonary thromboembolism) 2003 or 2004  . Hypertension   . GERD (gastroesophageal reflux disease)   . Bilateral chronic knee pain DJD  . 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 Allergic reaction 06/17/2012    Possibly from chili at [facility]  . Tongue swelling 06/16/2012    Possibly from chili at [facility]   Medications:  Prescriptions prior to admission  Medication Sig Dispense Refill  . diclofenac sodium (VOLTAREN) 1 % GEL Apply 1 application topically 3 (three) times daily as needed (For arthritis pain.).  100 g  0  . metoprolol (LOPRESSOR) 50 MG tablet Take 50 mg by mouth 2 (two) times daily.      Marland Kitchen omeprazole (PRILOSEC) 20 MG capsule Take 20 mg by mouth daily.      Marland Kitchen warfarin (COUMADIN) 1 MG tablet Take 1 mg by mouth daily. Takes 6MG  TOTAL on Tuesdays, Thursdays, Saturdays, and Sundays. (5mg  tablet and 1mg  tablet)      . warfarin (COUMADIN) 5 MG tablet Take 5 mg by mouth daily. *Takes 5mg  TOTAL on Mondays, Wednesdays, and Fridays*      . furosemide (LASIX) 20 MG tablet Take 20 mg by mouth 3 (three)  times a week. Takes on Mon, Wed, and Fri.       Assessment: Okay for Protocol On treatment dose Lovenox per MD, CBC monitoring ordered.  INR approaching goal.  Goal of Therapy:  INR Goal = 2.5 per MD request. Monitor platelets by anticoagulation protocol: Yes   Plan:  Lovenox treatment dose per MD. Warfarin 10mg  PO x 1 today Daily PT/INR. Education Completed.  Margo Aye, Holden Maniscalco A 12/29/2012,10:26 AM

## 2012-12-29 NOTE — Progress Notes (Signed)
UR Chart Review Completed  

## 2012-12-29 NOTE — Progress Notes (Signed)
*  PRELIMINARY RESULTS* Echocardiogram 2D Echocardiogram has been performed.  Conrad Ascutney 12/29/2012, 10:04 AM

## 2012-12-29 NOTE — Progress Notes (Signed)
Subjective: This man has been mobilizing in the hallways without dyspnea. In fact he feels his dyspnea has significantly improved. He had his echocardiogram done this morning. We will await the result.          Physical Exam: Blood pressure 155/91, pulse 80, temperature 97.9 F (36.6 C), temperature source Oral, resp. rate 16, height 5\' 11"  (1.803 m), weight 87 kg (191 lb 12.8 oz), SpO2 95.00%. Looks systemically well. Heart sounds are present and in atrial fibrillation. There is no peripheral pitting edema. Jugular venous pressure not raised. There is no evidence of right heart failure. Lung fields are entirely clear with no evidence of pleural rub. He is alert and orientated. Abdomen is soft and nontender. There are no masses felt. His right leg is swollen, he says this is chronic.   Investigations:     Basic Metabolic Panel:  Basename 12/29/12 0340 12/28/12 0636  NA 141 141  K 3.4* 3.5  CL 109 107  CO2 24 25  GLUCOSE 96 97  BUN 12 19  CREATININE 1.00 1.21  CALCIUM 8.7 8.7  MG -- --  PHOS -- --   Liver Function Tests:  [facility] 12/28/12 0636 12/26/12 1615  AST 13 15  ALT 6 9  ALKPHOS 53 67  BILITOT 0.9 1.1  PROT 5.9* 7.1  ALBUMIN 3.4* 4.2     CBC:  Basename 12/29/12 0340 12/27/12 0547 12/26/12 1615  WBC 3.6* 4.5 --  NEUTROABS -- -- 2.4  HGB 11.5* 13.1 --  HCT 36.0* 40.5 --  MCV 94.0 94.2 --  PLT 175 190 --    US Venous Img Lower Unilateral Right  12/27/2012  *RADIOLOGY REPORT*  Clinical Data: Pulmonary embolism, right lower extremity swelling, history of varicose vein stripping with the right leg, evaluate for DVT  RIGHT LOWER EXTREMITY VENOUS DUPLEX ULTRASOUND  Technique:  Gray-scale sonography with graded compression, as well as color Doppler and duplex ultrasound were performed to evaluate the deep venous system of the lower extremity from the level of the common femoral vein through the popliteal and proximal calf veins. Spectral Doppler was  utilized to evaluate flow at rest and with distal augmentation maneuvers.  Comparison:  None.  Findings:  Normal compressibility of the common femoral, superficial femoral, and popliteal veins is demonstrated, as well as the visualized proximal calf veins.  No filling defects to suggest DVT on grayscale or color Doppler imaging.  Doppler waveforms show normal direction of venous flow, normal respiratory phasicity and response to augmentation.  The proximal aspect of the right greater saphenous vein is narrowed and occluded.  Several varicosities are noted within the right calf.  IMPRESSION: 1.  No evidence of lower extremity deep vein thrombosis.  2.  The proximal aspect the right greater saphenous vein is narrowed and occluded, possibly the sequela of provided history of prior right lower extremity venous intervention.  Correlation with operative/sclerotherapy history is recommended.  Superficial thrombophlebitis cannot be excluded on the basis of this examination. 3.   Prominent varicosities noted within the calf suggestive of persistent venous insufficiency.   Original Report Authenticated By: Tacey Ruiz, MD       Medications: I have reviewed the patient's current medications.  Impression: 1. Bilateral multiple pulmonary emboli with large clot burden. Subtherapeutic INR when he was admitted. INR 1.87  this morning. 2. 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 3. Hypertension. 4. Atrial fibrillation, rate controlled. 5. Pulmonary hypertension but no clinical evidence of right  heart failure.      Plan: 1. Continue with anticoagulation. 2. Mobilize. 3. Await echocardiogram report. Although he clearly has a high clot burden, this has not translated into clinical significance in terms of right-sided heart failure. 4. Hopefully, he can be discharged home in the next day or 2.      LOS: 3 days   Wilson Singer Pager [phone]  12/29/2012, 9:44 AM

## 2012-12-30 LAB — PROTIME-INR: Prothrombin Time: 21.8 seconds — ABNORMAL HIGH (ref 11.6–15.2)

## 2012-12-30 MED ORDER — WARFARIN SODIUM 1 MG PO TABS: 1.0000 mg | ORAL_TABLET | Freq: Every day | ORAL | Status: DC

## 2012-12-30 MED ORDER — WARFARIN SODIUM 5 MG PO TABS: 5.0000 mg | ORAL_TABLET | Freq: Every day | ORAL | Status: DC

## 2012-12-30 MED ORDER — WARFARIN SODIUM 10 MG PO TABS: 10.0000 mg | ORAL_TABLET | Freq: Once | ORAL | Status: DC

## 2012-12-30 NOTE — Discharge Summary (Signed)
Physician Discharge Summary  Patient ID: Allen Marshall MRN: [id number] DOB/AGE: August 09, 1941 72 y.o.  Admit date: 12/26/2012 Discharge date: 12/30/2012  Discharge Diagnoses:  Active Problems:  Pulmonary embolism Biventricular failure  Chronic anticoagulation  HTN (hypertension)  A-fib    Medication List     As of 12/30/2012 12:58 PM    TAKE these medications         diclofenac sodium 1 % Gel   Commonly known as: VOLTAREN   Apply 1 application topically 3 (three) times daily as needed (For arthritis pain.).      furosemide 20 MG tablet   Commonly known as: LASIX   Take 20 mg by mouth 3 (three) times a week. Takes on Mon, Wed, and Fri.      metoprolol 50 MG tablet   Commonly known as: LOPRESSOR   Take 50 mg by mouth 2 (two) times daily.      omeprazole 20 MG capsule   Commonly known as: PRILOSEC   Take 20 mg by mouth daily.      warfarin 1 MG tablet   Commonly known as: COUMADIN   Take 1 tablet (1 mg total) by mouth daily.      warfarin 5 MG tablet   Commonly known as: COUMADIN   Take 1 tablet (5 mg total) by mouth daily.            Discharge Orders    Future Orders Please Complete By Expires   Diet - low sodium heart healthy      Increase activity slowly      Discharge instructions      Comments:   Take 6 mg coumadin EVERY DAY      Follow-up Information    Follow up with [facility], MD. On 01/06/2013. (at 10 am to check coumadin dose)    Contact information:   [address] [place] [address] [phone]          Disposition: 01-Home or Self Care  Discharged Condition: Stable  Consults:  none  Labs:    Sodium 142   142   141  141       Potassium 4.7   3.7    3.5  3.4       Chloride 106   105   107  109       CO2 26   25   25  24        BUN 19   20   19  12        Creatinine, Ser 1.23   1.50   1.21  1.00       Calcium 9.8   8.9   8.7  8.7       GFR calc non Af Amer 57   45   58  74       GFR calc Af Amer 66    52    68   85    Glucose, Bld 105   107   97  96       Alkaline Phosphatase 67      53         Albumin 4.2      3.4         AST 15      13         ALT 9      6         Total Protein 7.1      5.9  Total Bilirubin 1.1      0.9          CARDIAC PROFILE    Troponin I <0.30                Pro B Natriuretic peptide (BNP) 2253.0                CBC    WBC 4.9   4.5     3.6       RBC 4.60   4.30     3.83       Hemoglobin 14.0   13.1     11.5       HCT 43.2   40.5     36.0       MCV 93.9   94.2     94.0       MCH 30.4   30.5     30.0       MCHC 32.4   32.3     31.9       RDW 13.0   13.1     13.0       Platelets 192   190     175        DIFFERENTIAL    Neutrophils Relative 49               Lymphocytes Relative 42               Monocytes Relative 8               Eosinophils Relative 1               Basophils Relative 1               Neutro Abs 2.4               Lymphs Abs 2.1               Monocytes Absolute 0.4               Eosinophils Absolute 0.1               Basophils Absolute 0.1               Band Neutrophils 0                COAG OTHER    D-Dimer, Quant 9.77                 PROTIME W/ INR    Prothrombin Time 18.8   19.4   20.1  20.8   21.8    INR 1.63   1.70   1.78  1.87   1.99    Diagnostics:  Dg Chest 2 View  12/26/2012  *RADIOLOGY REPORT*  Clinical Data: Shortness of breath.  CHEST - 2 VIEW  Comparison: No priors.  Findings: Mild blunting of both costophrenic sulci may reflect some chronic pleuroparenchymal scarring or trace bilateral pleural effusions. Linear opacity projecting over the right lower lobe favored to represent subsegmental atelectasis and/or chronic scarring, although an area of early airspace consolidation is difficult to entirely exclude.  Lungs are otherwise clear.  No evidence of pulmonary edema.  Heart size is normal.  Mediastinal contours are unremarkable.  Atherosclerosis in the thoracic aorta.  IMPRESSION: 1.  Right lower lobe opacity is linear in  appearance favored to represent an area of subsegmental atelectasis and/or scarring, however, underlying early airspace consolidation is difficult to entirely exclude.  Clinical correlation is recommended. 2.  Atherosclerosis. 3.  Chronic pleuroparenchymal scarring versus trace bilateral pleural effusions.   Original Report Authenticated By: Trudie Reed, M.D.    Ct Angio Chest Pe W/cm &/or Wo Cm  12/26/2012  *RADIOLOGY REPORT*  Clinical Data: Shortness of breath.  The patient has a history of pulmonary embolism.  CT ANGIOGRAPHY CHEST  Technique:  Multidetector CT imaging of the chest using the standard protocol during bolus administration of intravenous contrast. Multiplanar reconstructed images including MIPs were obtained and reviewed to evaluate the vascular anatomy.  Contrast: OMNIPAQUE IOHEXOL 350 MG/ML SOLN  Comparison: Chest radiograph 12/26/2012  Findings: This is a technically good evaluation of the pulmonary arterial tree.  This exam is positive for pulmonary emboli.  There is clot within the distal right main pulmonary artery, extending into the lobar, segmental and subsegmental branches of the right middle lobe.  Clot is seen within all segmental branches to the right lower lobe, and into multiple subsegmental branches. Some of the branches to the right lower lobe appear completely occluded. Clot is present in the lobar and segmental branches to the right upper lobe.  Clot is present in segmental and subsegmental branches to the left lower lobe.  Probable clot within a segmental branch to the left upper lobe on image number 37.  Mild cardiomegaly.  There is straightening of the interventricular septum, for which right heart strain is suspected.  There is reflux of contrast into the inferior vena cava and hepatic veins suggesting elevated right heart pressures.  There is an enlarged prevascular lymph node measuring 17 x 11 mm. No additional enlarged mediastinal lymph nodes are detected.   There is no supraclavicular or axillary lymphadenopathy.  No hilar lymphadenopathy.  Negative for pleural or pericardial effusion.  There is some respiratory motion affecting the upper lungs on the lung windows.  Both lungs are well expanded.  The lungs are clear. There is no airspace disease, pulmonary mass or nodule, or an interstitial abnormality.  Minimal atelectasis in the dependent portions bilaterally.  Thoracic spine vertebral bodies are normal in height and alignment. Mild convex right curvature of the thoracic spine.  No acute bony abnormality.  IMPRESSION: 1. Positive for bilateral pulmonary emboli. There is a large clot burden in the right lower lobe; with a mild to moderate amount of clot in the left lower lobe, right upper lobe, and right middle lobe with possible small clot in the left upper lobe. There is evidence of right heart strain/elevated right heart pressures. Critical Value/emergent results were called by telephone at the time of interpretation on 12/26/2012 at 5:55 p.m. to Dr. Estell Harpin, who verbally acknowledged these results.                        2. The lungs are clear. 3.  Mild cardiomegaly. 4.  Single enlarged prevascular lymph node of uncertain etiology or chronicity.   Original Report Authenticated By: Britta Mccreedy, M.D.    US Venous Img Lower Unilateral Right  12/27/2012  *RADIOLOGY REPORT*  Clinical Data: Pulmonary embolism, right lower extremity swelling, history of varicose vein stripping with the right leg, evaluate for DVT  RIGHT LOWER EXTREMITY VENOUS DUPLEX ULTRASOUND  Technique:  Gray-scale sonography with graded compression, as well as color Doppler and duplex ultrasound were performed to evaluate the deep venous system of the lower extremity from the level of the common femoral vein through the popliteal and proximal calf veins. Spectral Doppler was utilized to  evaluate flow at rest and with distal augmentation maneuvers.  Comparison:  None.  Findings:  Normal  compressibility of the common femoral, superficial femoral, and popliteal veins is demonstrated, as well as the visualized proximal calf veins.  No filling defects to suggest DVT on grayscale or color Doppler imaging.  Doppler waveforms show normal direction of venous flow, normal respiratory phasicity and response to augmentation.  The proximal aspect of the right greater saphenous vein is narrowed and occluded.  Several varicosities are noted within the right calf.  IMPRESSION: 1.  No evidence of lower extremity deep vein thrombosis.  2.  The proximal aspect the right greater saphenous vein is narrowed and occluded, possibly the sequela of provided history of prior right lower extremity venous intervention.  Correlation with operative/sclerotherapy history is recommended.  Superficial thrombophlebitis cannot be excluded on the basis of this examination. 3.   Prominent varicosities noted within the calf suggestive of persistent venous insufficiency.   Original Report Authenticated By: Tacey Ruiz, MD    Echocardiogram Left ventricle: The cavity size was normal. Wall thickness was increased in a pattern of mild LVH. Systolic function was mildly to moderately reduced. The estimated ejection fraction was in the range of 40% to 45%. Diffuse hypokinesis. Features are consistent with a pseudonormal left ventricular filling pattern, with concomitant abnormal relaxation and increased filling pressure (grade 2 diastolic dysfunction). - Aortic valve: There was no stenosis. - Mitral valve: No significant regurgitation. - Left atrium: The atrium was mildly dilated. - Right ventricle: The cavity size was mildly dilated. Systolic function was moderately reduced. - Right atrium: The atrium was mildly dilated. - Pulmonary arteries: No complete TR doppler jet so unable to estimate PA systolic pressure. - Systemic veins: IVC not visualized. Impressions:  - Technically difficult study with poor acoustic  windows. Normal LV size with mild LV hypertrophy. EF 40% with global hypokinesis. Mildly dilated RV with moderately decreased systolic function. Biatrial enlargement.  EKG:  Atrial fibrillation Anterior infarct , age undetermined Full Code   Hospital Course: See H&P for complete admission details. The patient is a 72 year old black male with previous history of pulmonary embolus who presents with shortness of breath. CT angiogram showed bilateral pulmonary emboli with large clot burden and right heart strain. He had been on Coumadin prior to admission, but INR was subtherapeutic at 1.6. He was hemodynamically stable and had normal oxygen saturation. He gets much of his care at the [place], so old chart is unavailable. He was started on Lovenox and his Coumadin was adjusted upward. Echocardiogram showed right and left heart dysfunction. It is unclear whether this is new or old. He was on Lasix prior to admission and has a history of atrial fibrillation, so this may not be a new finding. He can followup as an outpatient for any further workup needed if this is new. He had no evidence of acute left heart failure at the time of admission. By the time of discharge, he was no longer short of breath. His INR is therapeutic. His Coumadin will be increased to 6 mg a day. She already has an appointment with Dr. Margo Aye scheduled for next week at which time an an INR will need to be checked and any adjustment made to Coumadin if needed. Total time on the day of discharge greater than 30 minutes.  Discharge Exam:  Blood pressure 133/85, pulse 73, temperature 97.8 F (36.6 C), temperature source Oral, resp. rate 16, height 5\' 11"  (1.803 m), weight 87 kg (  191 lb 12.8 oz), SpO2 96.00%.  General: Comfortable. Alert and oriented. Breathing nonlabored. Lungs clear to auscultation bilaterally without wheeze rhonchi or rales Cardiovascular regular rate rhythm without murmurs gallops rubs Extremities no clubbing cyanosis or  edema  Signed: Allyce Bochicchio L 12/30/2012, 12:58 PM

## 2012-12-30 NOTE — Progress Notes (Signed)
ANTICOAGULATION CONSULT NOTE  Pharmacy Consult for Warfarin Indication: pulmonary embolus   No Known Allergies  Patient Measurements: Height: 5\' 11"  (180.3 cm) Weight: 191 lb 12.8 oz (87 kg) IBW/kg (Calculated) : 75.3   Vital Signs: Temp: 97.8 F (36.6 C) (01/21 0428) Temp src: Oral (01/21 0428) BP: 133/85 mmHg (01/21 0428) Pulse Rate: 73  (01/21 0428)  Labs:  Basename 12/30/12 0500 12/29/12 0340 12/28/12 0636  HGB -- 11.5* --  HCT -- 36.0* --  PLT -- 175 --  APTT -- -- --  LABPROT 21.8* 20.8* 20.1*  INR 1.99* 1.87* 1.78*  HEPARINUNFRC -- -- --  CREATININE -- 1.00 1.21  CKTOTAL -- -- --  CKMB -- -- --  TROPONINI -- -- --   Estimated Creatinine Clearance: 72.2 ml/min (by C-G formula based on Cr of 1).  Medical History: Past Medical History  Diagnosis Date  . PE (pulmonary thromboembolism) 2003 or 2004  . Hypertension   . GERD (gastroesophageal reflux disease)   . Bilateral chronic knee pain DJD  . 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 Allergic reaction 06/17/2012    Possibly from chili at [facility]  . Tongue swelling 06/16/2012    Possibly from chili at [facility]   Medications:  Prescriptions prior to admission  Medication Sig Dispense Refill  . diclofenac sodium (VOLTAREN) 1 % GEL Apply 1 application topically 3 (three) times daily as needed (For arthritis pain.).  100 g  0  . metoprolol (LOPRESSOR) 50 MG tablet Take 50 mg by mouth 2 (two) times daily.      Marland Kitchen omeprazole (PRILOSEC) 20 MG capsule Take 20 mg by mouth daily.      Marland Kitchen warfarin (COUMADIN) 1 MG tablet Take 1 mg by mouth daily. Takes 6MG  TOTAL on Tuesdays, Thursdays, Saturdays, and Sundays. (5mg  tablet and 1mg  tablet)      . warfarin (COUMADIN) 5 MG tablet Take 5 mg by mouth daily. *Takes 5mg  TOTAL on Mondays, Wednesdays, and Fridays*      . furosemide (LASIX) 20 MG tablet Take 20 mg by mouth 3 (three) times a week. Takes on Mon, Wed, and Fri.        Assessment: 72 yo M with +PE on day#5/5 overlap Lovenox-->Coumadin.  INR ~2 today.  No bleeding noted.   Goal of Therapy:  INR Goal = 2.5 per MD request. Monitor platelets by anticoagulation protocol: Yes   Plan:  Lovenox treatment dose per MD. Continue until INR therapeutic x24hrs.  Warfarin 10mg  PO x 1 today Daily PT/INR. CBC on MWF  Elson Clan 12/30/2012,9:04 AM

## 2013-03-24 ENCOUNTER — Encounter: Payer: Self-pay | Admitting: *Deleted

## 2013-03-24 ENCOUNTER — Encounter: Payer: Self-pay | Admitting: Cardiology

## 2013-03-25 ENCOUNTER — Encounter: Payer: Self-pay | Admitting: *Deleted

## 2013-03-25 ENCOUNTER — Encounter: Payer: Self-pay | Admitting: Cardiology

## 2013-03-25 ENCOUNTER — Ambulatory Visit (INDEPENDENT_AMBULATORY_CARE_PROVIDER_SITE_OTHER): Payer: Medicare Other | Admitting: Cardiology

## 2013-03-25 VITALS — BP 120/80 | HR 98 | Ht 71.0 in | Wt 185.4 lb

## 2013-03-25 DIAGNOSIS — I2699 Other pulmonary embolism without acute cor pulmonale: Secondary | ICD-10-CM

## 2013-03-25 DIAGNOSIS — K219 Gastro-esophageal reflux disease without esophagitis: Secondary | ICD-10-CM | POA: Insufficient documentation

## 2013-03-25 DIAGNOSIS — M1710 Unilateral primary osteoarthritis, unspecified knee: Secondary | ICD-10-CM | POA: Insufficient documentation

## 2013-03-25 DIAGNOSIS — I4891 Unspecified atrial fibrillation: Secondary | ICD-10-CM

## 2013-03-25 DIAGNOSIS — R0609 Other forms of dyspnea: Secondary | ICD-10-CM

## 2013-03-25 DIAGNOSIS — Z7901 Long term (current) use of anticoagulants: Secondary | ICD-10-CM

## 2013-03-25 DIAGNOSIS — I10 Essential (primary) hypertension: Secondary | ICD-10-CM

## 2013-03-25 DIAGNOSIS — IMO0002 Reserved for concepts with insufficient information to code with codable children: Secondary | ICD-10-CM

## 2013-03-25 DIAGNOSIS — R0600 Dyspnea, unspecified: Secondary | ICD-10-CM

## 2013-03-25 DIAGNOSIS — R079 Chest pain, unspecified: Secondary | ICD-10-CM

## 2013-03-25 MED ORDER — METOPROLOL TARTRATE 50 MG PO TABS: 75.0000 mg | ORAL_TABLET | Freq: Two times a day (BID) | ORAL | Status: DC

## 2013-03-25 NOTE — Progress Notes (Signed)
Patient ID: Allen Marshall, male   DOB: 02/09/1941, 72 y.o.   MRN: [id number] HPI: Initial Cardiology evaluation for this nice gentleman referred by Dwana Melena, MD for assessment of atrial fibrillation. Patient's history is somewhat diffuse, but it appears that he suffered a DVT or pulmonary embolism approximately 10-15 years ago for which he has subsequently been treated with warfarin.  He presented with recurrent exertional dyspnea in January of this year and was found to have multiple bilateral pulmonary emboli and an INR of 1.6.  He was treated with heparin and warfarin dose readjusted. Since discharge, INR as have apparently been maintained in the therapeutic range, but patient continues to experience exertional dyspnea and intolerance. He has no chest discomfort. He has had no orthopnea nor PND. He has mild chronic edema of the right leg since venous stripping years ago.  He has never previously been evaluated by a cardiologist nor undergone any significant cardiac testing. EKG from 7/13 shows the presence of atrial fibrillation at that time with a controlled ventricular response. Echocardiogram from January indicates mild diffuse left ventricular dysfunction. Most recent TSH was from mid-2013 and was normal.  Current Outpatient Prescriptions on File Prior to Visit  Medication Sig Dispense Refill  . diclofenac sodium (VOLTAREN) 1 % GEL Apply 1 application topically 3 (three) times daily as needed (For arthritis pain.).  100 g  0  . furosemide (LASIX) 20 MG tablet Take 20 mg by mouth 3 (three) times a week. Takes on Mon, Wed, and Fri.      . metoprolol (LOPRESSOR) 50 MG tablet Take 50 mg by mouth 2 (two) times daily.      Marland Kitchen warfarin (COUMADIN) 1 MG tablet Take 1 tablet (1 mg total) by mouth daily.      Marland Kitchen warfarin (COUMADIN) 5 MG tablet Take 1 tablet (5 mg total) by mouth daily.      Marland Kitchen omeprazole (PRILOSEC) 20 MG capsule Take 20 mg by mouth daily.       No current facility-administered medications on  file prior to visit.   No Known Allergies  Past Medical History  Diagnosis Date  . PE (pulmonary thromboembolism) 2003 or 2004  . Hypertension   . GERD (gastroesophageal reflux disease)   . Bilateral chronic knee pain DJD  . 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 Allergic reaction 06/17/2012    Possibly from chili at [facility]  . Tongue swelling 06/16/2012    Possibly from chili at [facility]  . Atrial fibrillation     Past Surgical History  Procedure Laterality Date  . Varicose vein surgery  1970s    History reviewed. No pertinent family history.  History   Social History  . Marital Status: Widowed    Spouse Name: N/A    Number of Children: N/A  . Years of Education: N/A   Occupational History  . Not on file.   Social History Main Topics  . Smoking status: Former Games developer  . Smokeless tobacco: Former Neurosurgeon  . Alcohol Use: No  . Drug Use: No  . Sexually Active: Not Currently   Other Topics Concern  . Not on file   Social History Narrative  . No narrative on file    ROS: Decreased energy; constipation; negative colonoscopy in 2011; arthritic discomfort in both knees; generalized weakness; intermittent dizziness without loss of consciousness; intolerance to cold; insomnia; history of anemia..  All other systems reviewed and are negative.  PHYSICAL EXAM:  BP 120/80  Pulse 98  Ht 5\' 11"  (1.803 m)  Wt 84.097 kg (185 lb 6.4 oz)  BMI 25.87 kg/m2;  Body mass index is 25.87 kg/(m^2).   General-Well-developed; no acute distress Body Habitus-proportionate weight and height HEENT-[place]/AT; PERRL; EOM intact; conjunctiva and lids nl Neck-No JVD; no carotid bruits Endocrine-No thyromegaly Lungs-Clear lung fields; resonant percussion; normal I-to-E ratio Cardiovascular- normal PMI; normal S1 and S2 Abdomen-BS normal; soft and non-tender without masses or organomegaly Musculoskeletal-No deformities, cyanosis or clubbing Neurologic-Nl cranial  nerves; symmetric strength and tone Skin- Warm, no significant lesions Extremities-Nl distal pulses; no edema  EKG:  Atrial fibrillation with controlled ventricular response; slightly delayed R-wave progression; minimal nonspecific ST segment abnormality  No previous tracing for comparison.[place] Bing, MD 03/25/2013  1:45 PM  ASSESSMENT AND PLAN

## 2013-03-25 NOTE — Patient Instructions (Addendum)
Your physician recommends that you schedule a follow-up appointment in: After testing  Your physician has requested that you have an echocardiogram. Echocardiography is a painless test that uses sound waves to create images of your heart. It provides your doctor with information about the size and shape of your heart and how well your heart's chambers and valves are working. This procedure takes approximately one hour. There are no restrictions for this procedure.  Stress Myoview  Your physician has recommended you make the following change in your medication:  1 - INCREASE Metoprolol to 75 mg twice a day

## 2013-03-25 NOTE — Assessment & Plan Note (Signed)
Patient is tolerating anticoagulation well. If compliance and/or maintenance of a therapeutic INR continue to be an issue, conversion to a novel oral anticoagulant can be considered.

## 2013-03-25 NOTE — Assessment & Plan Note (Signed)
Patient is treated with a safe regimen, at least from a GI standpoint, of Voltaren ointment plus narcotic/acetaminophen as needed.

## 2013-03-25 NOTE — Assessment & Plan Note (Signed)
Symptoms currently controlled with a PPI.

## 2013-03-25 NOTE — Assessment & Plan Note (Signed)
Atrial fibrillation is chronic, and maintenance of sinus rhythm would be difficult. Heart rate control is marginal at this office visit dose of metoprolol will be increased to 75 mg twice a day. If he continues to experience fatigue and exercise intolerance, an alternative rate control agent can be considered.  Current symptoms could be related to physical deconditioning, residual adverse pulmonary effects of prior emboli, particularly pulmonary hypertension, atrial fibrillation, especially if exercise results in an excessive increase in heart rate, or other previously undiagnosed problem such as possible coronary artery disease. We will proceed with a repeat echocardiogram and stress test to further evaluate cardiac anatomy and function.

## 2013-03-25 NOTE — Progress Notes (Deleted)
Name: Allen Marshall    DOB: 01/21/1941  Age: 72 y.o.  MR#: [id number]       PCP:  Dwana Melena, MD      Insurance: Payor: Advertising copywriter MEDICARE  Plan: AARP MEDICARE COMPLETE  Product Type: *No Product type*    CC:   No chief complaint on file.  INR Managed by Dr Margo Aye  VS Filed Vitals:   03/25/13 1311  BP: 120/80  Pulse: 98  Height: 5\' 11"  (1.803 m)  Weight: 185 lb 6.4 oz (84.097 kg)    Weights Current Weight  03/25/13 185 lb 6.4 oz (84.097 kg)  12/29/12 191 lb 12.8 oz (87 kg)  06/17/12 198 lb 13.7 oz (90.2 kg)    Blood Pressure  BP Readings from Last 3 Encounters:  03/25/13 120/80  12/30/12 134/87  06/17/12 151/110     Admit date:  (Not on file) Last encounter with RMR:  Visit date not found   Allergy Review of patient's allergies indicates no known allergies.  Current Outpatient Prescriptions  Medication Sig Dispense Refill  . diclofenac sodium (VOLTAREN) 1 % GEL Apply 1 application topically 3 (three) times daily as needed (For arthritis pain.).  100 g  0  . furosemide (LASIX) 20 MG tablet Take 20 mg by mouth 3 (three) times a week. Takes on Mon, Wed, and Fri.      . metoprolol (LOPRESSOR) 50 MG tablet Take 50 mg by mouth 2 (two) times daily.      Marland Kitchen warfarin (COUMADIN) 1 MG tablet Take 1 tablet (1 mg total) by mouth daily.      Marland Kitchen warfarin (COUMADIN) 5 MG tablet Take 1 tablet (5 mg total) by mouth daily.      Marland Kitchen omeprazole (PRILOSEC) 20 MG capsule Take 20 mg by mouth daily.       No current facility-administered medications for this visit.    Discontinued Meds:   There are no discontinued medications.  Patient Active Problem List  Diagnosis  . HTN (hypertension), malignant  . Leukopenia  . Hx pulmonary embolism  . Warfarin-induced coagulopathy  . Tongue swelling  . Pulmonary embolism  . Right heart failure  . Chronic anticoagulation  . HTN (hypertension)  . A-fib    LABS    Component Value Date/Time   NA 141 12/29/2012 0340   NA 141 12/28/2012 0636    NA 142 12/27/2012 0547   K 3.4* 12/29/2012 0340   K 3.5 12/28/2012 0636   K 3.7 12/27/2012 0547   CL 109 12/29/2012 0340   CL 107 12/28/2012 0636   CL 105 12/27/2012 0547   CO2 24 12/29/2012 0340   CO2 25 12/28/2012 0636   CO2 25 12/27/2012 0547   GLUCOSE 96 12/29/2012 0340   GLUCOSE 97 12/28/2012 0636   GLUCOSE 107* 12/27/2012 0547   BUN 12 12/29/2012 0340   BUN 19 12/28/2012 0636   BUN 20 12/27/2012 0547   CREATININE 1.00 12/29/2012 0340   CREATININE 1.21 12/28/2012 0636   CREATININE 1.50* 12/27/2012 0547   CALCIUM 8.7 12/29/2012 0340   CALCIUM 8.7 12/28/2012 0636   CALCIUM 8.9 12/27/2012 0547   GFRNONAA 74* 12/29/2012 0340   GFRNONAA 58* 12/28/2012 0636   GFRNONAA 45* 12/27/2012 0547   GFRAA 85* 12/29/2012 0340   GFRAA 68* 12/28/2012 0636   GFRAA 52* 12/27/2012 0547   CMP     Component Value Date/Time   NA 141 12/29/2012 0340   K 3.4* 12/29/2012 0340   CL 109 12/29/2012 0340  CO2 24 12/29/2012 0340   GLUCOSE 96 12/29/2012 0340   BUN 12 12/29/2012 0340   CREATININE 1.00 12/29/2012 0340   CALCIUM 8.7 12/29/2012 0340   PROT 5.9* 12/28/2012 0636   ALBUMIN 3.4* 12/28/2012 0636   AST 13 12/28/2012 0636   ALT 6 12/28/2012 0636   ALKPHOS 53 12/28/2012 0636   BILITOT 0.9 12/28/2012 0636   GFRNONAA 74* 12/29/2012 0340   GFRAA 85* 12/29/2012 0340       Component Value Date/Time   WBC 3.6* 12/29/2012 0340   WBC 4.5 12/27/2012 0547   WBC 4.9 12/26/2012 1615   HGB 11.5* 12/29/2012 0340   HGB 13.1 12/27/2012 0547   HGB 14.0 12/26/2012 1615   HCT 36.0* 12/29/2012 0340   HCT 40.5 12/27/2012 0547   HCT 43.2 12/26/2012 1615   MCV 94.0 12/29/2012 0340   MCV 94.2 12/27/2012 0547   MCV 93.9 12/26/2012 1615    Lipid Panel  No results found for this basename: chol, trig, hdl, cholhdl, vldl, ldlcalc    ABG No results found for this basename: phart, pco2, pco2art, po2, po2art, hco3, tco2, acidbasedef, o2sat     Lab Results  Component Value Date   TSH 2.635 06/16/2012   BNP (last 3 results)  Recent Labs   12/26/12 1615  PROBNP 2253.0*   Cardiac Panel (last 3 results) No results found for this basename: CKTOTAL, CKMB, TROPONINI, RELINDX,  in the last 72 hours  Iron/TIBC/Ferritin    Component Value Date/Time   IRON 64 06/16/2012 0753   TIBC 230 06/16/2012 0753     EKG Orders placed in visit on 03/25/13  . EKG 12-LEAD     Prior Assessment and Plan Problem List as of 03/25/2013     ICD-9-CM     Cardiology Problems   HTN (hypertension), malignant   Pulmonary embolism   Right heart failure   HTN (hypertension)   A-fib     Other   Warfarin-induced coagulopathy   Leukopenia   Hx pulmonary embolism   Tongue swelling   Chronic anticoagulation       Imaging: No results found.

## 2013-04-03 ENCOUNTER — Ambulatory Visit (HOSPITAL_COMMUNITY)
Admission: RE | Admit: 2013-04-03 | Discharge: 2013-04-03 | Disposition: A | Discharge status: Home / Self Care | Payer: Medicare Other | Source: Ambulatory Visit | Attending: Cardiology | Admitting: Cardiology

## 2013-04-03 ENCOUNTER — Encounter (HOSPITAL_COMMUNITY)
Admission: RE | Admit: 2013-04-03 | Discharge: 2013-04-03 | Disposition: A | Discharge status: Home / Self Care | Payer: Medicare Other | Source: Ambulatory Visit | Attending: Cardiology | Admitting: Cardiology

## 2013-04-03 ENCOUNTER — Encounter (HOSPITAL_COMMUNITY): Payer: Self-pay

## 2013-04-03 ENCOUNTER — Encounter (HOSPITAL_COMMUNITY): Payer: Self-pay | Admitting: Cardiology

## 2013-04-03 DIAGNOSIS — R079 Chest pain, unspecified: Secondary | ICD-10-CM

## 2013-04-03 DIAGNOSIS — R5381 Other malaise: Secondary | ICD-10-CM | POA: Insufficient documentation

## 2013-04-03 DIAGNOSIS — R5383 Other fatigue: Secondary | ICD-10-CM | POA: Insufficient documentation

## 2013-04-03 DIAGNOSIS — I10 Essential (primary) hypertension: Secondary | ICD-10-CM | POA: Insufficient documentation

## 2013-04-03 DIAGNOSIS — R0989 Other specified symptoms and signs involving the circulatory and respiratory systems: Secondary | ICD-10-CM | POA: Insufficient documentation

## 2013-04-03 DIAGNOSIS — I4891 Unspecified atrial fibrillation: Secondary | ICD-10-CM | POA: Insufficient documentation

## 2013-04-03 DIAGNOSIS — R0600 Dyspnea, unspecified: Secondary | ICD-10-CM

## 2013-04-03 DIAGNOSIS — R0609 Other forms of dyspnea: Secondary | ICD-10-CM | POA: Insufficient documentation

## 2013-04-03 DIAGNOSIS — I517 Cardiomegaly: Secondary | ICD-10-CM

## 2013-04-03 MED ORDER — TECHNETIUM TC 99M SESTAMIBI - CARDIOLITE
10.0000 | Freq: Once | INTRAVENOUS | Status: AC | PRN
  Administered 2013-04-03: 10 via INTRAVENOUS

## 2013-04-03 MED ORDER — TECHNETIUM TC 99M SESTAMIBI - CARDIOLITE
30.0000 | Freq: Once | INTRAVENOUS | Status: AC | PRN
  Administered 2013-04-03: 12:00:00 30 via INTRAVENOUS

## 2013-04-03 MED ORDER — SODIUM CHLORIDE 0.9 % IJ SOLN
INTRAMUSCULAR | Status: AC
  Administered 2013-04-03: 10 mL via INTRAVENOUS
  Filled 2013-04-03: qty 10

## 2013-04-03 MED ORDER — REGADENOSON 0.4 MG/5ML IV SOLN
INTRAVENOUS | Status: AC
  Filled 2013-04-03: qty 5

## 2013-04-03 NOTE — Progress Notes (Signed)
Stress Lab Nurses Notes - Allen Marshall 04/03/2013 Reason for doing test: Chest Pain, Dyspnea and AFib Type of test: Stress Cardolite Nurse performing test: Parke Poisson, RN Nuclear Medicine Tech: Lyndel Pleasure Echo Tech: Not Applicable MD performing test: R. Rothbart & Joni Reining NP Family MD: Dr. Margo Aye Test explained and consent signed: yes IV started: 22g jelco, Saline lock flushed, No redness or edema and Saline lock started in radiology Symptoms: SOB & Dizziness Treatment/Intervention: None Reason test stopped: SOB & dizziness After recovery IV was: Discontinued via X-ray tech and No redness or edema Patient to return to Nuc. Med at : 12:35 Patient discharged: Home Patient's Condition upon discharge was: stable Comments: During test peak BP 114/59 & HR 134.  Recovery BP 142/78 & HR 88.  Symptoms resolved in recovery. [name] T

## 2013-04-05 ENCOUNTER — Encounter: Payer: Self-pay | Admitting: Cardiology

## 2013-04-08 ENCOUNTER — Encounter: Payer: Self-pay | Admitting: *Deleted

## 2013-04-08 ENCOUNTER — Ambulatory Visit (INDEPENDENT_AMBULATORY_CARE_PROVIDER_SITE_OTHER): Payer: Medicare Other | Admitting: Cardiology

## 2013-04-08 ENCOUNTER — Encounter: Payer: Self-pay | Admitting: Cardiology

## 2013-04-08 VITALS — BP 126/86 | HR 85 | Ht 71.0 in | Wt 184.0 lb

## 2013-04-08 DIAGNOSIS — I4891 Unspecified atrial fibrillation: Secondary | ICD-10-CM

## 2013-04-08 DIAGNOSIS — I10 Essential (primary) hypertension: Secondary | ICD-10-CM

## 2013-04-08 DIAGNOSIS — I2699 Other pulmonary embolism without acute cor pulmonale: Secondary | ICD-10-CM

## 2013-04-08 DIAGNOSIS — Z7901 Long term (current) use of anticoagulants: Secondary | ICD-10-CM

## 2013-04-08 LAB — CBC
HCT: 42.6 % (ref 39.0–52.0)
Hemoglobin: 13.7 g/dL (ref 13.0–17.0)
MCH: 30.1 pg (ref 26.0–34.0)
MCHC: 32.2 g/dL (ref 30.0–36.0)
MCV: 93.6 fL (ref 78.0–100.0)
Platelets: 191 K/uL (ref 150–400)
RBC: 4.55 MIL/uL (ref 4.22–5.81)
RDW: 12.6 % (ref 11.5–15.5)
WBC: 4.4 K/uL (ref 4.0–10.5)

## 2013-04-08 MED ORDER — LISINOPRIL 5 MG PO TABS: ORAL_TABLET | ORAL | Status: DC

## 2013-04-08 MED ORDER — LISINOPRIL 2.5 MG PO TABS: ORAL_TABLET | ORAL | Status: DC

## 2013-04-08 NOTE — Progress Notes (Signed)
Patient ID: Allen Marshall, male   DOB: 08-08-1941, 72 y.o.   MRN: [id number]  HPI: Schedule return visit for this pleasant gentleman with recurrent pulmonary emboli 3 months ago who returns for discussion of testing to evaluate exercise intolerance and exertional dyspnea. Stress testing verified poor exercise tolerance. On nuclear imaging, there is moderately impaired left ventricular systolic function and inferior ischemia. An echocardiogram verified the left ventricular dysfunction.  Current Outpatient Prescriptions  Medication Sig Dispense Refill  . diclofenac sodium (VOLTAREN) 1 % GEL Apply 1 application topically 3 (three) times daily as needed (For arthritis pain.).  100 g  0  . furosemide (LASIX) 20 MG tablet Take 20 mg by mouth 3 (three) times a week. Takes on Mon, Wed, and Fri.      Marland Kitchen HYDROcodone-acetaminophen (NORCO) 7.5-325 MG per tablet Take 1 tablet by mouth every 6 (six) hours as needed for pain.      . metoprolol (LOPRESSOR) 50 MG tablet Take 1.5 tablets (75 mg total) by mouth 2 (two) times daily.  90 tablet  6  . warfarin (COUMADIN) 1 MG tablet Take 1 tablet (1 mg total) by mouth daily.      Marland Kitchen warfarin (COUMADIN) 5 MG tablet Take 1 tablet (5 mg total) by mouth daily.      Marland Kitchen lisinopril (PRINIVIL,ZESTRIL) 5 MG tablet TAKE 2.5MG  (HALF TABLET ONE THE FIRST DAY) THEN 5 MG EVERYDAY THEREAFTER  30 tablet  5   No current facility-administered medications for this visit.   No Known Allergies   Past medical history, social history, and family history reviewed and updated.  ROS: Denies chest pain, palpitations, lightheadedness or syncope. All other systems reviewed and are negative.  [place] Bing, MD 04/08/2013  4:01 PM  ASSESSMENT AND PLAN Prolonged discussion (>30 minutes) to discuss the implications of his stress test and echocardiographic results. I advised that the stress test is suggestive of coronary artery disease and that left ventricular dysfunction may reflect myocardial  ischemia or previous infarction. I also raise the possibility of pulmonary hypertension caused by pulmonary emboli. I recommended proceeding with a right and left cardiac catheterization plus coronary angiography. He would prefer to manage all of his issues medically, but I explained that optimal treatment requires accurate diagnosis. He agrees to proceed with cardiac catheterization. An outpatient study will be performed. INR today is only 1.8, which will permit holding warfarin for only 2 days prior to his test and beginning treatment with Lovenox thereafter until INR is again therapeutic. Target should be 2.5-3.5 when warfarin is readjusted.

## 2013-04-08 NOTE — Progress Notes (Deleted)
Name: Allen Marshall    DOB: [birth year]  Age: 72 y.o.  MR#: [id number]       PCP:  Dwana Melena, MD      Insurance: Payor: Advertising copywriter MEDICARE  Plan: AARP MEDICARE COMPLETE  Product Type: *No Product type*    CC:   No chief complaint on file.  LIST VS Filed Vitals:   04/08/13 1409  BP: 126/86  Pulse: 85  Height: 5\' 11"  (1.803 m)  Weight: 184 lb (83.462 kg)    Weights Current Weight  04/08/13 184 lb (83.462 kg)  03/25/13 185 lb 6.4 oz (84.097 kg)  12/29/12 191 lb 12.8 oz (87 kg)    Blood Pressure  BP Readings from Last 3 Encounters:  04/08/13 126/86  03/25/13 120/80  12/30/12 134/87     Admit date:  (Not on file) Last encounter with RMR:  03/25/2013   Allergy Review of patient's allergies indicates no known allergies.  Current Outpatient Prescriptions  Medication Sig Dispense Refill  . diclofenac sodium (VOLTAREN) 1 % GEL Apply 1 application topically 3 (three) times daily as needed (For arthritis pain.).  100 g  0  . furosemide (LASIX) 20 MG tablet Take 20 mg by mouth 3 (three) times a week. Takes on Mon, Wed, and Fri.      Marland Kitchen HYDROcodone-acetaminophen (NORCO) 7.5-325 MG per tablet Take 1 tablet by mouth every 6 (six) hours as needed for pain.      . metoprolol (LOPRESSOR) 50 MG tablet Take 1.5 tablets (75 mg total) by mouth 2 (two) times daily.  90 tablet  6  . warfarin (COUMADIN) 1 MG tablet Take 1 tablet (1 mg total) by mouth daily.      Marland Kitchen warfarin (COUMADIN) 5 MG tablet Take 1 tablet (5 mg total) by mouth daily.       No current facility-administered medications for this visit.    Discontinued Meds:    Medications Discontinued During This Encounter  Medication Reason  . omeprazole (PRILOSEC) 20 MG capsule Error    Patient Active Problem List   Diagnosis Date Noted  . Pulmonary embolism   . Hypertension   . Degenerative joint disease of knee   . Atrial fibrillation   . Gastroesophageal reflux   . Chronic anticoagulation 12/26/2012    LABS     Component Value Date/Time   NA 141 12/29/2012 0340   NA 141 12/28/2012 0636   NA 142 12/27/2012 0547   K 3.4* 12/29/2012 0340   K 3.5 12/28/2012 0636   K 3.7 12/27/2012 0547   CL 109 12/29/2012 0340   CL 107 12/28/2012 0636   CL 105 12/27/2012 0547   CO2 24 12/29/2012 0340   CO2 25 12/28/2012 0636   CO2 25 12/27/2012 0547   GLUCOSE 96 12/29/2012 0340   GLUCOSE 97 12/28/2012 0636   GLUCOSE 107* 12/27/2012 0547   BUN 12 12/29/2012 0340   BUN 19 12/28/2012 0636   BUN 20 12/27/2012 0547   CREATININE 1.00 12/29/2012 0340   CREATININE 1.21 12/28/2012 0636   CREATININE 1.50* 12/27/2012 0547   CALCIUM 8.7 12/29/2012 0340   CALCIUM 8.7 12/28/2012 0636   CALCIUM 8.9 12/27/2012 0547   GFRNONAA 74* 12/29/2012 0340   GFRNONAA 58* 12/28/2012 0636   GFRNONAA 45* 12/27/2012 0547   GFRAA 85* 12/29/2012 0340   GFRAA 68* 12/28/2012 0636   GFRAA 52* 12/27/2012 0547   CMP     Component Value Date/Time   NA 141 12/29/2012 0340   K 3.4* 12/29/2012  0340   CL 109 12/29/2012 0340   CO2 24 12/29/2012 0340   GLUCOSE 96 12/29/2012 0340   BUN 12 12/29/2012 0340   CREATININE 1.00 12/29/2012 0340   CALCIUM 8.7 12/29/2012 0340   PROT 5.9* 12/28/2012 0636   ALBUMIN 3.4* 12/28/2012 0636   AST 13 12/28/2012 0636   ALT 6 12/28/2012 0636   ALKPHOS 53 12/28/2012 0636   BILITOT 0.9 12/28/2012 0636   GFRNONAA 74* 12/29/2012 0340   GFRAA 85* 12/29/2012 0340       Component Value Date/Time   WBC 3.6* 12/29/2012 0340   WBC 4.5 12/27/2012 0547   WBC 4.9 12/26/2012 1615   HGB 11.5* 12/29/2012 0340   HGB 13.1 12/27/2012 0547   HGB 14.0 12/26/2012 1615   HCT 36.0* 12/29/2012 0340   HCT 40.5 12/27/2012 0547   HCT 43.2 12/26/2012 1615   MCV 94.0 12/29/2012 0340   MCV 94.2 12/27/2012 0547   MCV 93.9 12/26/2012 1615    Lipid Panel  No results found for this basename: chol, trig, hdl, cholhdl, vldl, ldlcalc    ABG No results found for this basename: phart, pco2, pco2art, po2, po2art, hco3, tco2, acidbasedef, o2sat     Lab Results  Component Value  Date   TSH 2.635 06/16/2012   BNP (last 3 results)  Recent Labs  12/26/12 1615  PROBNP 2253.0*   Cardiac Panel (last 3 results) No results found for this basename: CKTOTAL, CKMB, TROPONINI, RELINDX,  in the last 72 hours  Iron/TIBC/Ferritin    Component Value Date/Time   IRON 64 06/16/2012 0753   TIBC 230 06/16/2012 0753     EKG Orders placed in visit on 03/25/13  . EKG 12-LEAD     Prior Assessment and Plan Problem List as of 04/08/2013     ICD-9-CM   Chronic anticoagulation   Last Assessment & Plan   03/25/2013 Office Visit Written 03/25/2013  2:14 PM by Kathlen Brunswick, MD     Patient is tolerating anticoagulation well. If compliance and/or maintenance of a therapeutic INR continue to be an issue, conversion to a novel oral anticoagulant can be considered.    Pulmonary embolism   Hypertension   Degenerative joint disease of knee   Last Assessment & Plan   03/25/2013 Office Visit Written 03/25/2013  2:14 PM by Kathlen Brunswick, MD     Patient is treated with a safe regimen, at least from a GI standpoint, of Voltaren ointment plus narcotic/acetaminophen as needed.    Atrial fibrillation   Last Assessment & Plan   03/25/2013 Office Visit Written 03/25/2013  2:12 PM by Kathlen Brunswick, MD     Atrial fibrillation is chronic, and maintenance of sinus rhythm would be difficult. Heart rate control is marginal at this office visit dose of metoprolol will be increased to 75 mg twice a day. If he continues to experience fatigue and exercise intolerance, an alternative rate control agent can be considered.  Current symptoms could be related to physical deconditioning, residual adverse pulmonary effects of prior emboli, particularly pulmonary hypertension, atrial fibrillation, especially if exercise results in an excessive increase in heart rate, or other previously undiagnosed problem such as possible coronary artery disease. We will proceed with a repeat echocardiogram and stress test to  further evaluate cardiac anatomy and function.    Gastroesophageal reflux   Last Assessment & Plan   03/25/2013 Office Visit Written 03/25/2013  2:17 PM by Kathlen Brunswick, MD     Symptoms currently controlled  with a PPI.        Imaging: Nm Myocar Single W/spect W/wall Motion And Ef  04/04/2013  Ordering Physician: Aldrich Bing  Reading Physician: [place] Bing  Clinical Data: 72 year old gentleman with atrial fibrillation, a history of pulmonary emboli and mildly impaired left ventricular systolic function presenting with exercise intolerance due to fatigue and dyspnea.  NUCLEAR MEDICINE STRESS MYOVIEW STUDY WITH SPECT AND LEFT VENTRICULAR EJECTION FRACTION  Radionuclide Data: One-day rest/stress protocol performed with 10/30 mCi of Tc-71m Myoview.  Stress Data: Treadmill exercise performed to a workload of 4.6 mets and a heart rate of 123, 82% of age predicted maximum. Exercise discontinued due to fatigue; no chest discomfort reported.  Blood pressure increased from a baseline of 130/95 to 140/80, a flat response.  No arrhythmias noted.  EKG: Atrial fibrillation with occasional PVCs and a controlled ventricular response; initial heart rate-90 bpm; prolonged QT interval; markedly delayed R-wave progression. Stress EKG:  No significant change.  Scintigraphic Data: Acquisition notable for mild diaphragmatic attenuation.  Left ventricular size was normal.  On tomographic images reconstructed in standard planes, there was a small to moderate defect of moderate intensity involving the inferior wall and extending inferoapically and inferoseptally.  By comparison to the resting portion of the study, moderate reversibility was apparent.  The gated reconstruction demonstrated global hypokinesis, most prominent in the septum and apex.  Overall LV systolic function was mildly depressed with an estimated ejection fraction of 40%.  Normal systolic accentuation of activity was present throughout.  IMPRESSION:  Abnormal stress nuclear myocardial study demonstrating impaired exercise capacity, a submaximal heart rate at low level of exercise raising the question of adequacy of stress, mildly impaired left ventricular systolic function in a somewhat segmental distribution and normal left ventricular size.  By scintigraphic imaging, ischemia in the distribution of the posterior descending coronary artery was identified.  Other findings as noted.   Original Report Authenticated By: [employer] Bing

## 2013-04-08 NOTE — Patient Instructions (Addendum)
Your physician recommends that you schedule a follow-up appointment in:   Your physician has requested that you have a cardiac catheterization. Cardiac catheterization is used to diagnose and/or treat various heart conditions. Doctors may recommend this procedure for a number of different reasons. The most common reason is to evaluate chest pain. Chest pain can be a symptom of coronary artery disease (CAD), and cardiac catheterization can show whether plaque is narrowing or blocking your heart's arteries. This procedure is also used to evaluate the valves, as well as measure the blood flow and oxygen levels in different parts of your heart. For further information please visit [email or web address] Please follow instruction sheet, as given.  LAST DOSE OF COUMADIN ON Friday MAY 2ND NO COUMADIN ON Saturday OR Sunday CATH APPOINTMENT Monday 04-13-13 Monday NIGHT 04-13-13 START XARELTO 20MG  WITH SUPPER ONE TIME DAILY KEEP FOLLOW UP WITH LISA ON ON 04-20-13 ALREADY SCHEDULED  Your physician recommends that you return for lab work in: TODAY (CBC, BMET, PT/INR, PTT) SLIPS GIVEN  Your physician recommends that you return for lab work in: 3 WEEKS Your physician recommends that you have follow up lab work, we will mail you a reminder letter to alert you when to go [employer], located across the street from our office.   Your physician has recommended you make the following change in your medication:   1) START LISINOPRIL (TAKE 2.5MG  ON THE FIRST DAY, THEN TAKE 5MG  TAB EVERY DAY THEREAFTER)   THE NUMBER TO FINANCIAL SERVICES [phone]

## 2013-04-09 ENCOUNTER — Encounter: Payer: Self-pay | Admitting: Cardiology

## 2013-04-09 ENCOUNTER — Encounter: Payer: Self-pay | Admitting: *Deleted

## 2013-04-09 LAB — BASIC METABOLIC PANEL
Chloride: 103 mEq/L (ref 96–112)
Glucose, Bld: 94 mg/dL (ref 70–99)
Potassium: 4.5 mEq/L (ref 3.5–5.3)
Sodium: 139 mEq/L (ref 135–145)

## 2013-04-09 LAB — APTT: aPTT: 42 seconds — ABNORMAL HIGH (ref 24–37)

## 2013-04-12 DIAGNOSIS — I429 Cardiomyopathy, unspecified: Secondary | ICD-10-CM

## 2013-04-13 ENCOUNTER — Inpatient Hospital Stay (HOSPITAL_BASED_OUTPATIENT_CLINIC_OR_DEPARTMENT_OTHER)
Admission: RE | Admit: 2013-04-13 | Discharge: 2013-04-13 | Disposition: A | Discharge status: Home / Self Care | Payer: Medicare Other | Source: Ambulatory Visit | Attending: Cardiovascular Disease | Admitting: Cardiovascular Disease

## 2013-04-13 ENCOUNTER — Telehealth: Payer: Self-pay | Admitting: Cardiology

## 2013-04-13 ENCOUNTER — Encounter (HOSPITAL_BASED_OUTPATIENT_CLINIC_OR_DEPARTMENT_OTHER)
Admission: RE | Disposition: A | Discharge status: Home / Self Care | Payer: Self-pay | Source: Ambulatory Visit | Attending: Cardiovascular Disease

## 2013-04-13 DIAGNOSIS — R0609 Other forms of dyspnea: Secondary | ICD-10-CM | POA: Insufficient documentation

## 2013-04-13 DIAGNOSIS — R9439 Abnormal result of other cardiovascular function study: Secondary | ICD-10-CM | POA: Insufficient documentation

## 2013-04-13 DIAGNOSIS — R0602 Shortness of breath: Secondary | ICD-10-CM

## 2013-04-13 DIAGNOSIS — Z7901 Long term (current) use of anticoagulants: Secondary | ICD-10-CM

## 2013-04-13 DIAGNOSIS — R0989 Other specified symptoms and signs involving the circulatory and respiratory systems: Secondary | ICD-10-CM | POA: Insufficient documentation

## 2013-04-13 DIAGNOSIS — I2510 Atherosclerotic heart disease of native coronary artery without angina pectoris: Secondary | ICD-10-CM

## 2013-04-13 DIAGNOSIS — I4891 Unspecified atrial fibrillation: Secondary | ICD-10-CM | POA: Diagnosis present

## 2013-04-13 DIAGNOSIS — I2699 Other pulmonary embolism without acute cor pulmonale: Secondary | ICD-10-CM | POA: Diagnosis present

## 2013-04-13 DIAGNOSIS — I519 Heart disease, unspecified: Secondary | ICD-10-CM | POA: Insufficient documentation

## 2013-04-13 DIAGNOSIS — I429 Cardiomyopathy, unspecified: Secondary | ICD-10-CM

## 2013-04-13 DIAGNOSIS — I10 Essential (primary) hypertension: Secondary | ICD-10-CM | POA: Diagnosis present

## 2013-04-13 DIAGNOSIS — Z86711 Personal history of pulmonary embolism: Secondary | ICD-10-CM | POA: Insufficient documentation

## 2013-04-13 LAB — POCT I-STAT 3, VENOUS BLOOD GAS (G3P V)
Acid-base deficit: 4 mmol/L — ABNORMAL HIGH (ref 0.0–2.0)
Bicarbonate: 22.1 mEq/L (ref 20.0–24.0)
O2 Saturation: 60 %
pO2, Ven: 34 mmHg (ref 30.0–45.0)

## 2013-04-13 LAB — POCT I-STAT 3, ART BLOOD GAS (G3+)
Bicarbonate: 20.9 mEq/L (ref 20.0–24.0)
TCO2: 22 mmol/L (ref 0–100)
pH, Arterial: 7.361 (ref 7.350–7.450)
pO2, Arterial: 65 mmHg — ABNORMAL LOW (ref 80.0–100.0)

## 2013-04-13 SURGERY — JV LEFT AND RIGHT HEART CATHETERIZATION WITH CORONARY ANGIOGRAM
Anesthesia: Moderate Sedation

## 2013-04-13 MED ORDER — SODIUM CHLORIDE 0.9 % IV SOLN: INTRAVENOUS | Status: DC

## 2013-04-13 MED ORDER — ACETAMINOPHEN 325 MG PO TABS: 650.0000 mg | ORAL_TABLET | ORAL | Status: DC | PRN

## 2013-04-13 MED ORDER — ONDANSETRON HCL 4 MG/2ML IJ SOLN: 4.0000 mg | Freq: Four times a day (QID) | INTRAMUSCULAR | Status: DC | PRN

## 2013-04-13 MED ORDER — SODIUM CHLORIDE 0.9 % IV SOLN
INTRAVENOUS | Status: DC
  Administered 2013-04-13: 09:00:00 via INTRAVENOUS

## 2013-04-13 MED ORDER — ATORVASTATIN CALCIUM 80 MG PO TABS: 80.0000 mg | ORAL_TABLET | ORAL | Status: DC

## 2013-04-13 MED ORDER — ATORVASTATIN CALCIUM 80 MG PO TABS: 80.0000 mg | ORAL_TABLET | Freq: Every day | ORAL | Status: DC

## 2013-04-13 MED ORDER — SODIUM CHLORIDE 0.9 % IJ SOLN: 3.0000 mL | INTRAMUSCULAR | Status: DC | PRN

## 2013-04-13 MED ORDER — SODIUM CHLORIDE 0.9 % IJ SOLN
3.0000 mL | Freq: Two times a day (BID) | INTRAMUSCULAR | Status: DC
Start: 2013-04-13 — End: 2013-04-13

## 2013-04-13 MED ORDER — ASPIRIN 81 MG PO CHEW
324.0000 mg | CHEWABLE_TABLET | ORAL | Status: AC
  Administered 2013-04-13: 324 mg via ORAL

## 2013-04-13 MED ORDER — SODIUM CHLORIDE 0.9 % IV SOLN: 250.0000 mL | INTRAVENOUS | Status: DC | PRN

## 2013-04-13 NOTE — H&P (View-Only) (Signed)
Patient ID: Allen Marshall, male   DOB: 12/16/1940, 71 y.o.   MRN: [id number]  HPI: Schedule return visit for this pleasant gentleman with recurrent pulmonary emboli 3 months ago who returns for discussion of testing to evaluate exercise intolerance and exertional dyspnea. Stress testing verified poor exercise tolerance. On nuclear imaging, there is moderately impaired left ventricular systolic function and inferior ischemia. An echocardiogram verified the left ventricular dysfunction.  Current Outpatient Prescriptions  Medication Sig Dispense Refill  . diclofenac sodium (VOLTAREN) 1 % GEL Apply 1 application topically 3 (three) times daily as needed (For arthritis pain.).  100 g  0  . furosemide (LASIX) 20 MG tablet Take 20 mg by mouth 3 (three) times a week. Takes on Mon, Wed, and Fri.      . HYDROcodone-acetaminophen (NORCO) 7.5-325 MG per tablet Take 1 tablet by mouth every 6 (six) hours as needed for pain.      . metoprolol (LOPRESSOR) 50 MG tablet Take 1.5 tablets (75 mg total) by mouth 2 (two) times daily.  90 tablet  6  . warfarin (COUMADIN) 1 MG tablet Take 1 tablet (1 mg total) by mouth daily.      . warfarin (COUMADIN) 5 MG tablet Take 1 tablet (5 mg total) by mouth daily.      . lisinopril (PRINIVIL,ZESTRIL) 5 MG tablet TAKE 2.5MG (HALF TABLET ONE THE FIRST DAY) THEN 5 MG EVERYDAY THEREAFTER  30 tablet  5   No current facility-administered medications for this visit.   No Known Allergies   Past medical history, social history, and family history reviewed and updated.  ROS: Denies chest pain, palpitations, lightheadedness or syncope. All other systems reviewed and are negative.  [name], MD 04/08/2013  4:01 PM  ASSESSMENT AND PLAN Prolonged discussion (>30 minutes) to discuss the implications of his stress test and echocardiographic results. I advised that the stress test is suggestive of coronary artery disease and that left ventricular dysfunction may reflect myocardial  ischemia or previous infarction. I also raise the possibility of pulmonary hypertension caused by pulmonary emboli. I recommended proceeding with a right and left cardiac catheterization plus coronary angiography. He would prefer to manage all of his issues medically, but I explained that optimal treatment requires accurate diagnosis. He agrees to proceed with cardiac catheterization. An outpatient study will be performed. INR today is only 1.8, which will permit holding warfarin for only 2 days prior to his test and beginning treatment with Lovenox thereafter until INR is again therapeutic. Target should be 2.5-3.5 when warfarin is readjusted.   

## 2013-04-13 NOTE — Interval H&P Note (Signed)
History and Physical Interval Note:  04/13/2013 8:54 AM  Wandra Mannan  has presented today for surgery, with the diagnosis of cardiomyapthy.  He has a hx of recurrent PE.  He has been scheduled for right and left heart cath.   The various methods of treatment have been discussed with the patient and family. After consideration of risks, benefits and other options for treatment, the patient has consented to  Procedure(s): JV LEFT AND RIGHT HEART CATHETERIZATION WITH CORONARY ANGIOGRAM (N/A) as a surgical intervention .  The patient's history has been reviewed, patient examined, no change in status, stable for surgery.  I have reviewed the patient's chart and labs.  Questions were answered to the patient's satisfaction.     Elyn Aquas.

## 2013-04-13 NOTE — Telephone Encounter (Signed)
Called pt to advise we gave samples of the xarelto 20mg  for him to take until he meets with lisa in 2 weeks to discuss going forward with xarelto per coumadin has not been managed by pt card Dr Macarthur Critchley in the past, pt has 2 weeks worth of samples with him at this time, spoke to pt daughter and advised pt has enough samples to last until 04-20-13 apt, pt daughter understood and will have pt follow up at advised, take xarelto 20mg  once daily starting tonight per had cardioversion today as directed

## 2013-04-13 NOTE — Telephone Encounter (Signed)
PT JUST HAD CATH DONE AND WAS BEING SWITCH TO XARELTO FROM COUMADIN. THIS RX HAS NOT BEEN CALLED IN YET AND THEY NEED IT TODAY.

## 2013-04-13 NOTE — CV Procedure (Signed)
    Cardiac Cath Note  Allen Marshall [id number] 04-03-1941  Procedure: Right and Left  Heart Cardiac Catheterization Note Indications: dyspnea  Procedure Details Consent: Obtained Time Out: Verified patient identification, verified procedure, site/side was marked, verified correct patient position, special equipment/implants available, Radiology Safety Procedures followed,  medications/allergies/relevent history reviewed, required imaging and test results available.  Performed   Medications: Fentanyl: 50 mcg IV Versed: 2 mg IV  The right femoral artery and right femoral vein were  easily canulated using a modified Seldinger technique.  Hemodynamics:   RA: 6/6/4 RV: 33/3/7 PCWP: 11/11/9 PA:  28/16/21  Cardiac Output   Thermodilution: 2.7  Index of 1.3  Fick : 4.3   Index of 2.1  Arterial Sat: 92% PA Sat: 60%.  LV pressure: 127/3/5 Aortic pressure: 126/82  Angiography  We used a JL4 and JL5 . AL1 catheters to image the left coronaries.   We did not find a separate ostia for the LCx and did not find an anomalous origin from the RCA  Left Main: The LM supplies the LAD only.  I was not able to find a separate ostia for the LCx.    Left anterior Descending: smooth and normal.  There are 2 diagonal branches that supply the lateral wall.    Left Circumflex: congenitally absent.  It appears that the long diagonals supply the lateral wall.   Right Coronary Artery: large, dominant. Moderate irregularities in proximal vessel ( 30-40%), 40 % distal stenosis.  The PLSA  Is normal  LV Gram: normal LV systolic function.  EF 60%.  Aortic root angiogram: no evidence of an anomalous LCx.    Complications: No apparent complications Patient did tolerate procedure well.  Contrast used: 125 cc  Conclusions:   Smooth and normal coronaries.  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  He has normal LV function.  We could consider getting a CTA of the coronaries to look for the LCx if  warranted.  Will let his primary cardiology team decide that.   Normal right heart pressures.    Vesta Mixer, Montez Hageman., MD, [facility] 04/13/2013, 9:55 AM Office - [phone] Pager [phone]

## 2013-04-20 ENCOUNTER — Ambulatory Visit (INDEPENDENT_AMBULATORY_CARE_PROVIDER_SITE_OTHER): Payer: Medicare Other | Admitting: *Deleted

## 2013-04-20 DIAGNOSIS — R079 Chest pain, unspecified: Secondary | ICD-10-CM

## 2013-04-20 DIAGNOSIS — I2699 Other pulmonary embolism without acute cor pulmonale: Secondary | ICD-10-CM

## 2013-04-20 DIAGNOSIS — Z7901 Long term (current) use of anticoagulants: Secondary | ICD-10-CM

## 2013-04-20 DIAGNOSIS — I4891 Unspecified atrial fibrillation: Secondary | ICD-10-CM

## 2013-04-20 NOTE — Patient Instructions (Signed)
Pt has been on coumadin managed by Dr Herma Carson. Margo Aye.  He was scheduled for cardiac cath and could not afford Lovenox bridge.  Dr Dietrich Pates took pt off coumadin 2 days prior to cath and switched to Xarelto after cath.  Pt now request to be switched back to coumadin due to cost of Xarelto.  Pt will take coumadin 10mg  x 3, 7.5mg  x 3 then resume 5mg  daily except 7.5mg  on M,W,F .  INR check on 5/19 then pt will go back to Dr Margo Aye.

## 2013-04-27 ENCOUNTER — Ambulatory Visit (INDEPENDENT_AMBULATORY_CARE_PROVIDER_SITE_OTHER): Payer: Medicare Other | Admitting: *Deleted

## 2013-04-27 DIAGNOSIS — Z7901 Long term (current) use of anticoagulants: Secondary | ICD-10-CM

## 2013-04-27 DIAGNOSIS — I2699 Other pulmonary embolism without acute cor pulmonale: Secondary | ICD-10-CM

## 2013-04-27 DIAGNOSIS — I4891 Unspecified atrial fibrillation: Secondary | ICD-10-CM

## 2013-05-01 ENCOUNTER — Ambulatory Visit (INDEPENDENT_AMBULATORY_CARE_PROVIDER_SITE_OTHER): Payer: Medicare Other | Admitting: Cardiology

## 2013-05-01 ENCOUNTER — Encounter: Payer: Self-pay | Admitting: Cardiology

## 2013-05-01 VITALS — BP 126/60 | HR 85 | Ht 71.0 in | Wt 181.0 lb

## 2013-05-01 DIAGNOSIS — I428 Other cardiomyopathies: Secondary | ICD-10-CM

## 2013-05-01 DIAGNOSIS — F17201 Nicotine dependence, unspecified, in remission: Secondary | ICD-10-CM | POA: Insufficient documentation

## 2013-05-01 DIAGNOSIS — I429 Cardiomyopathy, unspecified: Secondary | ICD-10-CM

## 2013-05-01 DIAGNOSIS — Z7901 Long term (current) use of anticoagulants: Secondary | ICD-10-CM

## 2013-05-01 DIAGNOSIS — R0602 Shortness of breath: Secondary | ICD-10-CM

## 2013-05-01 DIAGNOSIS — Z87891 Personal history of nicotine dependence: Secondary | ICD-10-CM

## 2013-05-01 DIAGNOSIS — I10 Essential (primary) hypertension: Secondary | ICD-10-CM

## 2013-05-01 DIAGNOSIS — I4891 Unspecified atrial fibrillation: Secondary | ICD-10-CM

## 2013-05-01 MED ORDER — DILTIAZEM HCL ER 180 MG PO CP24: 180.0000 mg | ORAL_CAPSULE | Freq: Every day | ORAL | Status: DC

## 2013-05-01 NOTE — Progress Notes (Deleted)
Name: Tyton Abdallah    DOB: 31-Jul-1941  Age: 72 y.o.  MR#: [id number]       PCP:  Catalina Pizza, MD      Insurance: Payor: Advertising copywriter MEDICARE / Plan: AARP MEDICARE COMPLETE / Product Type: *No Product type* /   CC:   No chief complaint on file.  No list VS Filed Vitals:   05/01/13 1340  BP: 126/60  Pulse: 85  Height: 5\' 11"  (1.803 m)  Weight: 181 lb (82.101 kg)    Weights Current Weight  05/01/13 181 lb (82.101 kg)  04/13/13 184 lb (83.462 kg)  04/13/13 184 lb (83.462 kg)    Blood Pressure  BP Readings from Last 3 Encounters:  05/01/13 126/60  04/13/13 97/45  04/13/13 97/45     Admit date:  (Not on file) Last encounter with RMR:  04/13/2013   Allergy Review of patient's allergies indicates no known allergies.  Current Outpatient Prescriptions  Medication Sig Dispense Refill  . diclofenac sodium (VOLTAREN) 1 % GEL Apply 1 application topically 3 (three) times daily as needed (For arthritis pain.).  100 g  0  . furosemide (LASIX) 20 MG tablet Take 20 mg by mouth 3 (three) times a week. Takes on Mon, Wed, and Fri.      Marland Kitchen HYDROcodone-acetaminophen (NORCO) 7.5-325 MG per tablet Take 1 tablet by mouth every 6 (six) hours as needed for pain.      Marland Kitchen lisinopril (PRINIVIL,ZESTRIL) 5 MG tablet TAKE 2.5MG  (HALF TABLET ONE THE FIRST DAY) THEN 5 MG EVERYDAY THEREAFTER  30 tablet  5  . metoprolol (LOPRESSOR) 50 MG tablet Take 1.5 tablets (75 mg total) by mouth 2 (two) times daily.  90 tablet  6  . warfarin (COUMADIN) 1 MG tablet Take 1 tablet (1 mg total) by mouth daily.      Marland Kitchen warfarin (COUMADIN) 5 MG tablet Take 1 tablet (5 mg total) by mouth daily.       No current facility-administered medications for this visit.    Discontinued Meds:   There are no discontinued medications.  Patient Active Problem List   Diagnosis Date Noted  . Long term (current) use of anticoagulants 04/20/2013  . Cardiomyopathy 04/12/2013  . Cardiomyopathy 04/12/2013  . Pulmonary embolism   .  Hypertension   . Degenerative joint disease of knee   . Atrial fibrillation   . Gastroesophageal reflux   . Chronic anticoagulation 12/26/2012    LABS    Component Value Date/Time   NA 139 04/08/2013 1504   NA 141 12/29/2012 0340   NA 141 12/28/2012 0636   K 4.5 04/08/2013 1504   K 3.4* 12/29/2012 0340   K 3.5 12/28/2012 0636   CL 103 04/08/2013 1504   CL 109 12/29/2012 0340   CL 107 12/28/2012 0636   CO2 27 04/08/2013 1504   CO2 24 12/29/2012 0340   CO2 25 12/28/2012 0636   GLUCOSE 94 04/08/2013 1504   GLUCOSE 96 12/29/2012 0340   GLUCOSE 97 12/28/2012 0636   BUN 16 04/08/2013 1504   BUN 12 12/29/2012 0340   BUN 19 12/28/2012 0636   CREATININE 1.26 04/08/2013 1504   CREATININE 1.00 12/29/2012 0340   CREATININE 1.21 12/28/2012 0636   CREATININE 1.50* 12/27/2012 0547   CALCIUM 9.6 04/08/2013 1504   CALCIUM 8.7 12/29/2012 0340   CALCIUM 8.7 12/28/2012 0636   GFRNONAA 74* 12/29/2012 0340   GFRNONAA 58* 12/28/2012 0636   GFRNONAA 45* 12/27/2012 0547   GFRAA 85* 12/29/2012 0340  GFRAA 68* 12/28/2012 0636   GFRAA 52* 12/27/2012 0547   CMP     Component Value Date/Time   NA 139 04/08/2013 1504   K 4.5 04/08/2013 1504   CL 103 04/08/2013 1504   CO2 27 04/08/2013 1504   GLUCOSE 94 04/08/2013 1504   BUN 16 04/08/2013 1504   CREATININE 1.26 04/08/2013 1504   CREATININE 1.00 12/29/2012 0340   CALCIUM 9.6 04/08/2013 1504   PROT 5.9* 12/28/2012 0636   ALBUMIN 3.4* 12/28/2012 0636   AST 13 12/28/2012 0636   ALT 6 12/28/2012 0636   ALKPHOS 53 12/28/2012 0636   BILITOT 0.9 12/28/2012 0636   GFRNONAA 74* 12/29/2012 0340   GFRAA 85* 12/29/2012 0340       Component Value Date/Time   WBC 4.4 04/08/2013 1504   WBC 3.6* 12/29/2012 0340   WBC 4.5 12/27/2012 0547   HGB 13.7 04/08/2013 1504   HGB 11.5* 12/29/2012 0340   HGB 13.1 12/27/2012 0547   HCT 42.6 04/08/2013 1504   HCT 36.0* 12/29/2012 0340   HCT 40.5 12/27/2012 0547   MCV 93.6 04/08/2013 1504   MCV 94.0 12/29/2012 0340   MCV 94.2 12/27/2012 0547    Lipid Panel   No results found for this basename: chol, trig, hdl, cholhdl, vldl, ldlcalc    ABG    Component Value Date/Time   PHART 7.361 04/13/2013 0916   PCO2ART 36.9 04/13/2013 0916   PO2ART 65.0* 04/13/2013 0916   HCO3 22.1 04/13/2013 0925   TCO2 23 04/13/2013 0925   ACIDBASEDEF 4.0* 04/13/2013 0925   O2SAT 60.0 04/13/2013 0925     Lab Results  Component Value Date   TSH 2.635 06/16/2012   BNP (last 3 results)  Recent Labs  12/26/12 1615  PROBNP 2253.0*   Cardiac Panel (last 3 results) No results found for this basename: CKTOTAL, CKMB, TROPONINI, RELINDX,  in the last 72 hours  Iron/TIBC/Ferritin    Component Value Date/Time   IRON 64 06/16/2012 0753   TIBC 230 06/16/2012 0753     EKG Orders placed in visit on 05/01/13  . EKG 12-LEAD     Prior Assessment and Plan Problem List as of 05/01/2013   Chronic anticoagulation   Last Assessment & Plan   03/25/2013 Office Visit Written 03/25/2013  2:14 PM by Kathlen Brunswick, MD     Patient is tolerating anticoagulation well. If compliance and/or maintenance of a therapeutic INR continue to be an issue, conversion to a novel oral anticoagulant can be considered.    Pulmonary embolism   Hypertension   Degenerative joint disease of knee   Last Assessment & Plan   03/25/2013 Office Visit Written 03/25/2013  2:14 PM by Kathlen Brunswick, MD     Patient is treated with a safe regimen, at least from a GI standpoint, of Voltaren ointment plus narcotic/acetaminophen as needed.    Atrial fibrillation   Last Assessment & Plan   03/25/2013 Office Visit Written 03/25/2013  2:12 PM by Kathlen Brunswick, MD     Atrial fibrillation is chronic, and maintenance of sinus rhythm would be difficult. Heart rate control is marginal at this office visit dose of metoprolol will be increased to 75 mg twice a day. If he continues to experience fatigue and exercise intolerance, an alternative rate control agent can be considered.  Current symptoms could be related to physical  deconditioning, residual adverse pulmonary effects of prior emboli, particularly pulmonary hypertension, atrial fibrillation, especially if exercise results in an excessive  increase in heart rate, or other previously undiagnosed problem such as possible coronary artery disease. We will proceed with a repeat echocardiogram and stress test to further evaluate cardiac anatomy and function.    Gastroesophageal reflux   Last Assessment & Plan   03/25/2013 Office Visit Written 03/25/2013  2:17 PM by Kathlen Brunswick, MD     Symptoms currently controlled with a PPI.    Cardiomyopathy   Cardiomyopathy   Long term (current) use of anticoagulants       Imaging: Nm Myocar Single W/spect W/wall Motion And Ef  04/04/2013   Ordering Physician: [place] Bing  Reading Physician:  Bing  Clinical Data: 72 year old gentleman with atrial fibrillation, a history of pulmonary emboli and mildly impaired left ventricular systolic function presenting with exercise intolerance due to fatigue and dyspnea.  NUCLEAR MEDICINE STRESS MYOVIEW STUDY WITH SPECT AND LEFT VENTRICULAR EJECTION FRACTION  Radionuclide Data: One-day rest/stress protocol performed with 10/30 mCi of Tc-77m Myoview.  Stress Data: Treadmill exercise performed to a workload of 4.6 mets and a heart rate of 123, 82% of age predicted maximum. Exercise discontinued due to fatigue; no chest discomfort reported.  Blood pressure increased from a baseline of 130/95 to 140/80, a flat response.  No arrhythmias noted.  EKG: Atrial fibrillation with occasional PVCs and a controlled ventricular response; initial heart rate-90 bpm; prolonged QT interval; markedly delayed R-wave progression. Stress EKG:  No significant change.  Scintigraphic Data: Acquisition notable for mild diaphragmatic attenuation.  Left ventricular size was normal.  On tomographic images reconstructed in standard planes, there was a small to moderate defect of moderate intensity involving the  inferior wall and extending inferoapically and inferoseptally.  By comparison to the resting portion of the study, moderate reversibility was apparent.  The gated reconstruction demonstrated global hypokinesis, most prominent in the septum and apex.  Overall LV systolic function was mildly depressed with an estimated ejection fraction of 40%.  Normal systolic accentuation of activity was present throughout.  IMPRESSION: Abnormal stress nuclear myocardial study demonstrating impaired exercise capacity, a submaximal heart rate at low level of exercise raising the question of adequacy of stress, mildly impaired left ventricular systolic function in a somewhat segmental distribution and normal left ventricular size.  By scintigraphic imaging, ischemia in the distribution of the posterior descending coronary artery was identified.  Other findings as noted.   Original Report Authenticated By:  Bing

## 2013-05-01 NOTE — Patient Instructions (Addendum)
Your physician recommends that you schedule a follow-up appointment in: ONE MONTH WITH RR/ONE WEEK WITH RHYTHM STRIP  Your physician recommends that you increase your activity are tolerated INCREASE EXERCISE WITH WALKING  Your physician has recommended that you have a pulmonary function test. Pulmonary Function Tests are a group of tests that measure how well air moves in and out of your lungs.IN ONE WEEK   Your physician has recommended you make the following change in your medication:   1) STOP METOPROLOL 2) START DILTIAZEM 180MG  ONCE DAILY ON Sunday 05-03-13

## 2013-05-01 NOTE — Assessment & Plan Note (Signed)
Despite extensive smoking history, patient has not had symptoms to suggest substantial chronic lung disease, had negative office spirometry in 2012 and has a benign examination. Nonetheless, we will proceed with formal PFTs and an ABG. Most likely, dyspnea is related to physical deconditioning. Increased activity recommended.

## 2013-05-01 NOTE — Assessment & Plan Note (Signed)
Adjustment of warfarin dosing has been rendered more difficult by recent testing including holding medication for cardiac catheterization. We are persisting with that drug, but conversion to a novel oral anticoagulant is still under consideration.

## 2013-05-01 NOTE — Progress Notes (Signed)
Patient ID: Adonay Scheier, male   DOB: Mar 03, 1941, 72 y.o.   MRN: [id number]  HPI: Schedule return visit for this nice gentleman currently under evaluation for atrial fibrillation and exertional dyspnea. 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  LV systolic function, estimated to be mildly to moderately depressed by echocardiography and a stress nuclear study, was judged normal on ventriculography. Intracardiac pressures were normal.  Current Outpatient Prescriptions  Medication Sig Dispense Refill  . diclofenac sodium (VOLTAREN) 1 % GEL Apply 1 application topically 3 (three) times daily as needed (For arthritis pain.).  100 g  0  . furosemide (LASIX) 20 MG tablet Take 20 mg by mouth 3 (three) times a week. Takes on Mon, Wed, and Fri.      Marland Kitchen HYDROcodone-acetaminophen (NORCO) 7.5-325 MG per tablet Take 1 tablet by mouth every 6 (six) hours as needed for pain.      Marland Kitchen lisinopril (PRINIVIL,ZESTRIL) 5 MG tablet TAKE 2.5MG  (HALF TABLET ONE THE FIRST DAY) THEN 5 MG EVERYDAY THEREAFTER  30 tablet  5  . warfarin (COUMADIN) 1 MG tablet Take 1 tablet (1 mg total) by mouth daily.      Marland Kitchen warfarin (COUMADIN) 5 MG tablet Take 1 tablet (5 mg total) by mouth daily.      Marland Kitchen diltiazem (DILACOR XR) 180 MG 24 hr capsule Take 1 capsule (180 mg total) by mouth daily.  30 capsule  5   No current facility-administered medications for this visit.   No Known Allergies   Past medical history, social history, and family history reviewed and updated.  60-80 pack-year history of cigarette smoking discontinued approximately 1 decade ago. Patient denies chronic cough or sputum production. He is never been diagnosed with significant pulmonary problems.  PHYSICAL EXAM: BP 126/60  Pulse 85  Ht 5\' 11"  (1.803 m)  Wt 82.101 kg (181 lb)  BMI 25.26 kg/m2;  Body mass index is 25.26 kg/(m^2). General-Well developed; no acute distress Body  habitus-proportionate weight and height Neck-No JVD; no carotid bruits Lungs-clear lung fields; resonant to percussion; mildly prolonged expiratory phase Cardiovascular-normal PMI; normal S1 and S2; irregular rhythm Abdomen-normal bowel sounds; soft and non-tender without masses or organomegaly Musculoskeletal-No deformities, no cyanosis or clubbing Neurologic-Normal cranial nerves; symmetric strength and tone Skin-Warm, no significant lesions Extremities-distal pulses intact; no edema  [place] Bing, MD 05/01/2013  2:42 PM  ASSESSMENT AND PLAN

## 2013-05-01 NOTE — Assessment & Plan Note (Signed)
Arrhythmias chronic. We have no specific etiology, but onset may have been in conjunction with prior episodes of pulmonary embolization. We will continue current strategy of control of ventricular rate, which has been excellent, and anticoagulation.

## 2013-05-06 ENCOUNTER — Ambulatory Visit (HOSPITAL_COMMUNITY): Admission: RE | Admit: 2013-05-06 | Payer: Medicare Other | Source: Ambulatory Visit

## 2013-05-11 ENCOUNTER — Ambulatory Visit (HOSPITAL_COMMUNITY)
Admission: RE | Admit: 2013-05-11 | Discharge: 2013-05-11 | Disposition: A | Discharge status: Home / Self Care | Payer: Medicare Other | Source: Ambulatory Visit | Attending: Cardiology | Admitting: Cardiology

## 2013-05-11 ENCOUNTER — Encounter (HOSPITAL_COMMUNITY): Payer: Medicare Other

## 2013-05-11 ENCOUNTER — Encounter: Payer: Self-pay | Admitting: *Deleted

## 2013-05-11 DIAGNOSIS — J449 Chronic obstructive pulmonary disease, unspecified: Secondary | ICD-10-CM | POA: Insufficient documentation

## 2013-05-11 DIAGNOSIS — I10 Essential (primary) hypertension: Secondary | ICD-10-CM

## 2013-05-11 DIAGNOSIS — R0989 Other specified symptoms and signs involving the circulatory and respiratory systems: Secondary | ICD-10-CM | POA: Insufficient documentation

## 2013-05-11 DIAGNOSIS — J4489 Other specified chronic obstructive pulmonary disease: Secondary | ICD-10-CM | POA: Insufficient documentation

## 2013-05-11 DIAGNOSIS — R0609 Other forms of dyspnea: Secondary | ICD-10-CM | POA: Insufficient documentation

## 2013-05-11 DIAGNOSIS — R0602 Shortness of breath: Secondary | ICD-10-CM

## 2013-05-11 LAB — BLOOD GAS, ARTERIAL
Patient temperature: 37
pCO2 arterial: 33.1 mmHg — ABNORMAL LOW (ref 35.0–45.0)
pH, Arterial: 7.41 (ref 7.350–7.450)

## 2013-05-11 MED ORDER — ALBUTEROL SULFATE (5 MG/ML) 0.5% IN NEBU
2.5000 mg | INHALATION_SOLUTION | Freq: Once | RESPIRATORY_TRACT | Status: AC
  Administered 2013-05-11: 2.5 mg via RESPIRATORY_TRACT

## 2013-05-14 NOTE — Procedures (Signed)
Allen Marshall, Allen Marshall               ACCOUNT NO.:  [id number]  MEDICAL RECORD NO.:  [id number]  LOCATION:                                 FACILITY:  PHYSICIAN:  Santhiago L. Juanetta Gosling, M.D.DATE OF BIRTH:  11-Nov-1941  DATE OF PROCEDURE:  05/13/2013 DATE OF DISCHARGE:                           PULMONARY FUNCTION TEST   REASON FOR PULMONARY FUNCTION TESTING:  Hypertension.  1. Spirometry shows no ventilatory defect, but does show airflow     obstruction. 2. Lung volumes are normal. 3. DLCO is severely reduced. 4. Airway resistance is normal. 5. Arterial blood gas is normal. 6. There is no significant bronchodilator improvement. 7. This is the patient's smoking history, this study is consistent     with COPD.     Emma L. Juanetta Gosling, M.D.     ELH/MEDQ  D:  05/13/2013  T:  05/14/2013  Job:  [id number]

## 2013-05-15 ENCOUNTER — Encounter: Payer: Self-pay | Admitting: Cardiology

## 2013-05-21 LAB — PULMONARY FUNCTION TEST

## 2013-06-03 ENCOUNTER — Encounter: Payer: Self-pay | Admitting: Cardiology

## 2013-06-03 ENCOUNTER — Ambulatory Visit (INDEPENDENT_AMBULATORY_CARE_PROVIDER_SITE_OTHER): Payer: Medicare Other | Admitting: Cardiology

## 2013-06-03 VITALS — BP 90/60 | HR 52 | Ht 71.0 in | Wt 176.0 lb

## 2013-06-03 DIAGNOSIS — I10 Essential (primary) hypertension: Secondary | ICD-10-CM

## 2013-06-03 DIAGNOSIS — Z7901 Long term (current) use of anticoagulants: Secondary | ICD-10-CM

## 2013-06-03 DIAGNOSIS — I4891 Unspecified atrial fibrillation: Secondary | ICD-10-CM

## 2013-06-03 DIAGNOSIS — I2699 Other pulmonary embolism without acute cor pulmonale: Secondary | ICD-10-CM

## 2013-06-03 DIAGNOSIS — R0602 Shortness of breath: Secondary | ICD-10-CM

## 2013-06-03 MED ORDER — LISINOPRIL 5 MG PO TABS: 5.0000 mg | ORAL_TABLET | Freq: Every day | ORAL | Status: DC

## 2013-06-03 MED ORDER — FUROSEMIDE 20 MG PO TABS: 20.0000 mg | ORAL_TABLET | Freq: Every day | ORAL | Status: AC | PRN

## 2013-06-03 MED ORDER — ALBUTEROL SULFATE HFA 108 (90 BASE) MCG/ACT IN AERS: 2.0000 | INHALATION_SPRAY | Freq: Four times a day (QID) | RESPIRATORY_TRACT | Status: DC | PRN

## 2013-06-03 MED ORDER — BREATHERITE VALVED MDI CHAMBER DEVI: Status: DC

## 2013-06-03 NOTE — Progress Notes (Deleted)
Name: Allen Marshall    DOB: 1941/08/27  Age: 72 y.o.  MR#: [id number]       PCP:  Catalina Pizza, MD      Insurance: Payor: Advertising copywriter MEDICARE / Plan: AARP MEDICARE COMPLETE / Product Type: *No Product type* /   CC:   No chief complaint on file.  NO LIST VS Filed Vitals:   06/03/13 1412  BP: 90/60  Pulse: 52  Height: 5\' 11"  (1.803 m)  Weight: 176 lb (79.833 kg)    Weights Current Weight  06/03/13 176 lb (79.833 kg)  05/01/13 181 lb (82.101 kg)  04/13/13 184 lb (83.462 kg)    Blood Pressure  BP Readings from Last 3 Encounters:  06/03/13 90/60  05/01/13 126/60  04/13/13 97/45     Admit date:  (Not on file) Last encounter with RMR:  05/01/2013   Allergy Review of patient's allergies indicates no known allergies.  Current Outpatient Prescriptions  Medication Sig Dispense Refill  . diclofenac sodium (VOLTAREN) 1 % GEL Apply 1 application topically 3 (three) times daily as needed (For arthritis pain.).  100 g  0  . diltiazem (DILACOR XR) 180 MG 24 hr capsule Take 1 capsule (180 mg total) by mouth daily.  30 capsule  5  . furosemide (LASIX) 20 MG tablet Take 20 mg by mouth 3 (three) times a week. Takes on Mon, Wed, and Fri.      Marland Kitchen HYDROcodone-acetaminophen (NORCO) 7.5-325 MG per tablet Take 1 tablet by mouth every 6 (six) hours as needed for pain.      Marland Kitchen lisinopril (PRINIVIL,ZESTRIL) 5 MG tablet 5 mg 2 (two) times daily. Take one and half twice daily      . warfarin (COUMADIN) 1 MG tablet Take 1 tablet (1 mg total) by mouth daily.      Marland Kitchen warfarin (COUMADIN) 5 MG tablet Take 1 tablet (5 mg total) by mouth daily.       No current facility-administered medications for this visit.    Discontinued Meds:    Medications Discontinued During This Encounter  Medication Reason  . lisinopril (PRINIVIL,ZESTRIL) 5 MG tablet     Patient Active Problem List   Diagnosis Date Noted  . Tobacco abuse, in remission   . Cardiomyopathy-ruled out 04/12/2013  . Pulmonary embolism   .  Hypertension   . Degenerative joint disease of knee   . Atrial fibrillation   . Gastroesophageal reflux   . Chronic anticoagulation 12/26/2012    LABS    Component Value Date/Time   NA 139 04/08/2013 1504   NA 141 12/29/2012 0340   NA 141 12/28/2012 0636   K 4.5 04/08/2013 1504   K 3.4* 12/29/2012 0340   K 3.5 12/28/2012 0636   CL 103 04/08/2013 1504   CL 109 12/29/2012 0340   CL 107 12/28/2012 0636   CO2 27 04/08/2013 1504   CO2 24 12/29/2012 0340   CO2 25 12/28/2012 0636   GLUCOSE 94 04/08/2013 1504   GLUCOSE 96 12/29/2012 0340   GLUCOSE 97 12/28/2012 0636   BUN 16 04/08/2013 1504   BUN 12 12/29/2012 0340   BUN 19 12/28/2012 0636   CREATININE 1.26 04/08/2013 1504   CREATININE 1.00 12/29/2012 0340   CREATININE 1.21 12/28/2012 0636   CREATININE 1.50* 12/27/2012 0547   CALCIUM 9.6 04/08/2013 1504   CALCIUM 8.7 12/29/2012 0340   CALCIUM 8.7 12/28/2012 0636   GFRNONAA 74* 12/29/2012 0340   GFRNONAA 58* 12/28/2012 0636   GFRNONAA 45* 12/27/2012 0547  GFRAA 85* 12/29/2012 0340   GFRAA 68* 12/28/2012 0636   GFRAA 52* 12/27/2012 0547   CMP     Component Value Date/Time   NA 139 04/08/2013 1504   K 4.5 04/08/2013 1504   CL 103 04/08/2013 1504   CO2 27 04/08/2013 1504   GLUCOSE 94 04/08/2013 1504   BUN 16 04/08/2013 1504   CREATININE 1.26 04/08/2013 1504   CREATININE 1.00 12/29/2012 0340   CALCIUM 9.6 04/08/2013 1504   PROT 5.9* 12/28/2012 0636   ALBUMIN 3.4* 12/28/2012 0636   AST 13 12/28/2012 0636   ALT 6 12/28/2012 0636   ALKPHOS 53 12/28/2012 0636   BILITOT 0.9 12/28/2012 0636   GFRNONAA 74* 12/29/2012 0340   GFRAA 85* 12/29/2012 0340       Component Value Date/Time   WBC 4.4 04/08/2013 1504   WBC 3.6* 12/29/2012 0340   WBC 4.5 12/27/2012 0547   HGB 13.7 04/08/2013 1504   HGB 11.5* 12/29/2012 0340   HGB 13.1 12/27/2012 0547   HCT 42.6 04/08/2013 1504   HCT 36.0* 12/29/2012 0340   HCT 40.5 12/27/2012 0547   MCV 93.6 04/08/2013 1504   MCV 94.0 12/29/2012 0340   MCV 94.2 12/27/2012 0547    Lipid Panel   No results found for this basename: chol, trig, hdl, cholhdl, vldl, ldlcalc    ABG    Component Value Date/Time   PHART 7.410 05/11/2013 1130   PCO2ART 33.1* 05/11/2013 1130   PO2ART 94.7 05/11/2013 1130   HCO3 20.6 05/11/2013 1130   TCO2 18.0 05/11/2013 1130   ACIDBASEDEF 3.3* 05/11/2013 1130   O2SAT 97.1 05/11/2013 1130     Lab Results  Component Value Date   TSH 2.635 06/16/2012   BNP (last 3 results)  Recent Labs  12/26/12 1615  PROBNP 2253.0*   Cardiac Panel (last 3 results) No results found for this basename: CKTOTAL, CKMB, TROPONINI, RELINDX,  in the last 72 hours  Iron/TIBC/Ferritin    Component Value Date/Time   IRON 64 06/16/2012 0753   TIBC 230 06/16/2012 0753     EKG Orders placed in visit on 05/14/13  . EKG     Prior Assessment and Plan Problem List as of 06/03/2013   Chronic anticoagulation   Last Assessment & Plan   05/01/2013 Office Visit Written 05/01/2013  2:47 PM by Kathlen Brunswick, MD     Adjustment of warfarin dosing has been rendered more difficult by recent testing including holding medication for cardiac catheterization. We are persisting with that drug, but conversion to a novel oral anticoagulant is still under consideration.    Pulmonary embolism   Hypertension   Degenerative joint disease of knee   Last Assessment & Plan   03/25/2013 Office Visit Written 03/25/2013  2:14 PM by Kathlen Brunswick, MD     Patient is treated with a safe regimen, at least from a GI standpoint, of Voltaren ointment plus narcotic/acetaminophen as needed.    Atrial fibrillation   Last Assessment & Plan   05/01/2013 Office Visit Written 05/01/2013  2:45 PM by Kathlen Brunswick, MD     Arrhythmias chronic. We have no specific etiology, but onset may have been in conjunction with prior episodes of pulmonary embolization. We will continue current strategy of control of ventricular rate, which has been excellent, and anticoagulation.    Gastroesophageal reflux   Last Assessment &  Plan   03/25/2013 Office Visit Written 03/25/2013  2:17 PM by Kathlen Brunswick, MD  Symptoms currently controlled with a PPI.    Cardiomyopathy-ruled out   Tobacco abuse, in remission   Last Assessment & Plan   05/01/2013 Office Visit Written 05/01/2013  2:52 PM by Kathlen Brunswick, MD     Despite extensive smoking history, patient has not had symptoms to suggest substantial chronic lung disease, had negative office spirometry in 2012 and has a benign examination. Nonetheless, we will proceed with formal PFTs and an ABG. Most likely, dyspnea is related to physical deconditioning. Increased activity recommended.        Imaging: No results found.

## 2013-06-03 NOTE — Assessment & Plan Note (Addendum)
Not only is blood pressure adequately controlled, but is on the low side. Patient denies any symptoms, most notably dizziness, loss of consciousness or falls. In order to maintain systolic blood pressure above 147, lisinopril will be discontinued. Intermittent furosemide is yielding questionable benefit and will be changed to an as needed basis. Patient will monitor blood pressure and report any particularly high or particularly low values or symptoms that appear related to cerebral hypoperfusion.

## 2013-06-03 NOTE — Assessment & Plan Note (Signed)
Apparently permanent atrial fibrillation at present with controlled ventricular response. Current medication is maintaining adequate control of heart rate and will be continued.  Patient believes that institution of treatment with diltiazem resulted in substantial symptomatic improvement.

## 2013-06-03 NOTE — Assessment & Plan Note (Signed)
Adequacy of anticoagulation improved of late, but still not optimal. Patient cannot afford a novel oral anticoagulant.  Dr. Margo Aye is managing treatment with warfarin.

## 2013-06-03 NOTE — Patient Instructions (Addendum)
Your physician recommends that you schedule a follow-up appointment in: ONE MONTH  Your physician has recommended you make the following change in your medication:   1) CHANGE LASIX TO 20MG  ONCE DAILY AND PRN FOR SIGNIFICANT ANKLE SWELLING 2) CHANGE LISINOPRIL TO 5MG  ONCE DAILY  3) START ALBUTEROL INHALER WITH SPACER, TAKE 2 PUFFS THREE TIMES DAILY

## 2013-06-03 NOTE — Assessment & Plan Note (Addendum)
Achieving at least average results with dose management of warfarin is desirable in this patient with recurrent pulmonary embolization. This would represent maintaining INR of 2-3.5 at least 70% of the time. Dr. Margo Aye is attempting to achieve this. ABG: 7.41, 33, 95-no CO2 retention; no hypoxemia.

## 2013-06-03 NOTE — Progress Notes (Signed)
Patient ID: Lemoyne Nestor, male   DOB: Apr 11, 1941, 72 y.o.   MRN: [id number]  HPI: Scheduled return visit for continued assessment and treatment of dyspnea.  Patient reports an 80% improvement in his symptoms, which he attributes to treatment with diltiazem. He is not at all certain that furosemide or lisinopril have been beneficial.  Current Outpatient Prescriptions  Medication Sig Dispense Refill  . diclofenac sodium (VOLTAREN) 1 % GEL Apply 1 application topically 3 (three) times daily as needed (For arthritis pain.).  100 g  0  . diltiazem (DILACOR XR) 180 MG 24 hr capsule Take 1 capsule (180 mg total) by mouth daily.  30 capsule  5  . furosemide (LASIX) 20 MG tablet Take 20 mg by mouth 3 (three) times a week. Takes on Mon, Wed, and Fri.      Marland Kitchen HYDROcodone-acetaminophen (NORCO) 7.5-325 MG per tablet Take 1 tablet by mouth every 6 (six) hours as needed for pain.      Marland Kitchen lisinopril (PRINIVIL,ZESTRIL) 5 MG tablet 5 mg 2 (two) times daily. Take one and half twice daily      . warfarin (COUMADIN) 1 MG tablet Take 1 tablet (1 mg total) by mouth daily.      Marland Kitchen warfarin (COUMADIN) 5 MG tablet Take 1 tablet (5 mg total) by mouth daily.       No current facility-administered medications for this visit.   No Known Allergies   Past medical history, social history, and family history reviewed and updated.  ROS: Denies peripheral edema, orthopnea, PND, weight gain or syncope. All other systems reviewed and are negative.  PHYSICAL EXAM: BP 90/60  Pulse 52  Ht 5\' 11"  (1.803 m)  Wt 79.833 kg (176 lb)  BMI 24.56 kg/m2;  Body mass index is 24.56 kg/(m^2). General-Well developed; no acute distress Body habitus-proportionate weight and height Neck-No JVD; no carotid bruits Lungs-clear lung fields; resonant to percussion; slightly prolonged expiratory phase Cardiovascular-normal PMI; normal S1 and S2; irregular rhythm Abdomen-normal bowel sounds; soft and non-tender without masses or  organomegaly Musculoskeletal-No deformities, no cyanosis or clubbing Neurologic-Normal cranial nerves; symmetric strength and tone Skin-Warm, no significant lesions Extremities-distal pulses intact; 1/2+ pitting edema of the left ankle; 1-2+ nonpitting edema on the right  [place] Bing, MD 06/03/2013  2:26 PM  ASSESSMENT AND PLAN

## 2013-06-26 ENCOUNTER — Ambulatory Visit (INDEPENDENT_AMBULATORY_CARE_PROVIDER_SITE_OTHER): Payer: Medicare Other | Admitting: Adult Health

## 2013-06-26 ENCOUNTER — Encounter: Payer: Self-pay | Admitting: Adult Health

## 2013-06-26 VITALS — BP 110/50 | HR 50 | Ht 71.0 in | Wt 173.0 lb

## 2013-06-26 DIAGNOSIS — I10 Essential (primary) hypertension: Secondary | ICD-10-CM

## 2013-06-26 DIAGNOSIS — I4891 Unspecified atrial fibrillation: Secondary | ICD-10-CM

## 2013-06-26 DIAGNOSIS — R0602 Shortness of breath: Secondary | ICD-10-CM

## 2013-06-26 MED ORDER — METOPROLOL TARTRATE 50 MG PO TABS: 75.0000 mg | ORAL_TABLET | Freq: Two times a day (BID) | ORAL | Status: DC

## 2013-06-26 MED ORDER — DILTIAZEM HCL ER 180 MG PO CP24: 180.0000 mg | ORAL_CAPSULE | Freq: Every day | ORAL | Status: DC

## 2013-06-26 NOTE — Progress Notes (Deleted)
Name: Allen Marshall    DOB: [birth year]  Age: 72 y.o.  MR#: [id number]       PCP:  Catalina Pizza, MD      Insurance: Payor: [employer]vertising [employer] MEDICARE / Plan: AARP MEDICARE COMPLETE / Product Type: *No Product type* /   CC:    Chief Complaint  Patient presents with  . Shortness of Breath    VS Filed Vitals:   06/26/13 1338  BP: 110/50  Pulse: 50  Height: 5\' 11"  (1.803 m)  Weight: 173 lb (78.472 kg)  SpO2: 97%    Weights Current Weight  06/26/13 173 lb (78.472 kg)  06/03/13 176 lb (79.833 kg)  05/01/13 181 lb (82.101 kg)    Blood Pressure  BP Readings from Last 3 Encounters:  06/26/13 110/50  06/03/13 90/60  05/01/13 126/60     Admit date:  (Not on file) Last encounter with RMR:  Visit date not found   Allergy Review of patient's allergies indicates no known allergies.  Current Outpatient Prescriptions  Medication Sig Dispense Refill  . diclofenac sodium (VOLTAREN) 1 % GEL Apply 1 application topically 3 (three) times daily as needed (For arthritis pain.).  100 g  0  . diltiazem (DILACOR XR) 180 MG 24 hr capsule Take 1 capsule (180 mg total) by mouth daily.  30 capsule  5  . furosemide (LASIX) 20 MG tablet Take 1 tablet (20 mg total) by mouth daily as needed.  90 tablet  1  . HYDROcodone-acetaminophen (NORCO) 7.5-325 MG per tablet Take 1 tablet by mouth every 6 (six) hours as needed for pain.      . metoprolol (LOPRESSOR) 50 MG tablet Take 75 mg by mouth 2 (two) times daily.      Marland Kitchen omeprazole (PRILOSEC) 20 MG capsule Take 20 mg by mouth daily.      Marland Kitchen warfarin (COUMADIN) 1 MG tablet Take 1 tablet (1 mg total) by mouth daily.      Marland Kitchen warfarin (COUMADIN) 5 MG tablet Take 1 tablet (5 mg total) by mouth daily.       No current facility-administered medications for this visit.    Discontinued Meds:    Medications Discontinued During This Encounter  Medication Reason  . albuterol (PROVENTIL HFA;VENTOLIN HFA) 108 (90 BASE) MCG/ACT inhaler Completed Course  . lisinopril  (PRINIVIL,ZESTRIL) 5 MG tablet Completed Course  . Respiratory Therapy Supplies (BREATHERITE VALVED MDI CHAMBER) DEVI Completed Course    Patient Active Problem List   Diagnosis Date Noted  . Tobacco abuse, in remission   . Cardiomyopathy-ruled out 04/12/2013  . Pulmonary embolism   . Hypertension   . Degenerative joint disease of knee   . Atrial fibrillation   . Gastroesophageal reflux   . Chronic anticoagulation 12/26/2012    LABS    Component Value Date/Time   NA 139 04/08/2013 1504   NA 141 12/29/2012 0340   NA 141 12/28/2012 0636   K 4.5 04/08/2013 1504   K 3.4* 12/29/2012 0340   K 3.5 12/28/2012 0636   CL 103 04/08/2013 1504   CL 109 12/29/2012 0340   CL 107 12/28/2012 0636   CO2 27 04/08/2013 1504   CO2 24 12/29/2012 0340   CO2 25 12/28/2012 0636   GLUCOSE 94 04/08/2013 1504   GLUCOSE 96 12/29/2012 0340   GLUCOSE 97 12/28/2012 0636   BUN 16 04/08/2013 1504   BUN 12 12/29/2012 0340   BUN 19 12/28/2012 0636   CREATININE 1.26 04/08/2013 1504   CREATININE 1.00 12/29/2012  0340   CREATININE 1.21 12/28/2012 0636   CREATININE 1.50* 12/27/2012 0547   CALCIUM 9.6 04/08/2013 1504   CALCIUM 8.7 12/29/2012 0340   CALCIUM 8.7 12/28/2012 0636   GFRNONAA 74* 12/29/2012 0340   GFRNONAA 58* 12/28/2012 0636   GFRNONAA 45* 12/27/2012 0547   GFRAA 85* 12/29/2012 0340   GFRAA 68* 12/28/2012 0636   GFRAA 52* 12/27/2012 0547   CMP     Component Value Date/Time   NA 139 04/08/2013 1504   K 4.5 04/08/2013 1504   CL 103 04/08/2013 1504   CO2 27 04/08/2013 1504   GLUCOSE 94 04/08/2013 1504   BUN 16 04/08/2013 1504   CREATININE 1.26 04/08/2013 1504   CREATININE 1.00 12/29/2012 0340   CALCIUM 9.6 04/08/2013 1504   PROT 5.9* 12/28/2012 0636   ALBUMIN 3.4* 12/28/2012 0636   AST 13 12/28/2012 0636   ALT 6 12/28/2012 0636   ALKPHOS 53 12/28/2012 0636   BILITOT 0.9 12/28/2012 0636   GFRNONAA 74* 12/29/2012 0340   GFRAA 85* 12/29/2012 0340       Component Value Date/Time   WBC 4.4 04/08/2013 1504   WBC 3.6* 12/29/2012  0340   WBC 4.5 12/27/2012 0547   HGB 13.7 04/08/2013 1504   HGB 11.5* 12/29/2012 0340   HGB 13.1 12/27/2012 0547   HCT 42.6 04/08/2013 1504   HCT 36.0* 12/29/2012 0340   HCT 40.5 12/27/2012 0547   MCV 93.6 04/08/2013 1504   MCV 94.0 12/29/2012 0340   MCV 94.2 12/27/2012 0547    Lipid Panel  No results found for this basename: chol, trig, hdl, cholhdl, vldl, ldlcalc    ABG    Component Value Date/Time   PHART 7.410 05/11/2013 1130   PCO2ART 33.1* 05/11/2013 1130   PO2ART 94.7 05/11/2013 1130   HCO3 20.6 05/11/2013 1130   TCO2 18.0 05/11/2013 1130   ACIDBASEDEF 3.3* 05/11/2013 1130   O2SAT 97.1 05/11/2013 1130     Lab Results  Component Value Date   TSH 2.635 06/16/2012   BNP (last 3 results)  Recent Labs  12/26/12 1615  PROBNP 2253.0*   Cardiac Panel (last 3 results) No results found for this basename: CKTOTAL, CKMB, TROPONINI, RELINDX,  in the last 72 hours  Iron/TIBC/Ferritin    Component Value Date/Time   IRON 64 06/16/2012 0753   TIBC 230 06/16/2012 0753     EKG Orders placed in visit on 05/14/13  . EKG     Prior Assessment and Plan Problem List as of 06/26/2013     Cardiovascular and Mediastinum   Pulmonary embolism   Last Assessment & Plan   06/03/2013 Office Visit Edited 06/03/2013  7:03 PM by Kathlen Brunswick, MD     Achieving at least average results with dose management of warfarin is desirable in this patient with recurrent pulmonary embolization. This would represent maintaining INR of 2-3.5 at least 70% of the time. Dr. Margo Aye is attempting to achieve this. ABG: 7.41, 33, 95-no CO2 retention; no hypoxemia.    Hypertension   Last Assessment & Plan   06/03/2013 Office Visit Edited 06/04/2013  9:32 PM by Kathlen Brunswick, MD     Not only is blood pressure adequately controlled, but is on the low side. Patient denies any symptoms, most notably dizziness, loss of consciousness or falls. In order to maintain systolic blood pressure above 914, lisinopril will be discontinued.  Intermittent furosemide is yielding questionable benefit and will be changed to an as needed basis. Patient will monitor  blood pressure and report any particularly high or particularly low values or symptoms that appear related to cerebral hypoperfusion.    Atrial fibrillation   Last Assessment & Plan   06/03/2013 Office Visit Written 06/03/2013  6:55 PM by Kathlen Brunswick, MD     Apparently permanent atrial fibrillation at present with controlled ventricular response. Current medication is maintaining adequate control of heart rate and will be continued.  Patient believes that institution of treatment with diltiazem resulted in substantial symptomatic improvement.    Cardiomyopathy-ruled out     Digestive   Gastroesophageal reflux   Last Assessment & Plan   03/25/2013 Office Visit Written 03/25/2013  2:17 PM by Kathlen Brunswick, MD     Symptoms currently controlled with a PPI.      Musculoskeletal and Integument   Degenerative joint disease of knee   Last Assessment & Plan   03/25/2013 Office Visit Written 03/25/2013  2:14 PM by Kathlen Brunswick, MD     Patient is treated with a safe regimen, at least from a GI standpoint, of Voltaren ointment plus narcotic/acetaminophen as needed.      Other   Chronic anticoagulation   Last Assessment & Plan   06/03/2013 Office Visit Written 06/03/2013  6:57 PM by Kathlen Brunswick, MD     Adequacy of anticoagulation improved of late, but still not optimal. Patient cannot afford a novel oral anticoagulant.  Dr. Margo Aye is managing treatment with warfarin.    Tobacco abuse, in remission   Last Assessment & Plan   05/01/2013 Office Visit Written 05/01/2013  2:52 PM by Kathlen Brunswick, MD     Despite extensive smoking history, patient has not had symptoms to suggest substantial chronic lung disease, had negative office spirometry in 2012 and has a benign examination. Nonetheless, we will proceed with formal PFTs and an ABG. Most likely, dyspnea is related to  physical deconditioning. Increased activity recommended.        Imaging: No results found.

## 2013-06-26 NOTE — Assessment & Plan Note (Addendum)
Heart rate is well controlled currently, somewhat bradycardic and asymptomatic. He will be continued on current medications. Refills are provided.Coumadin dose is managed by Dr. Margo Aye. He is compliant.

## 2013-06-26 NOTE — Patient Instructions (Addendum)
Your physician recommends that you schedule a follow-up appointment in: 59 MONTH You will receive a reminder letter in the mail in about 7 months reminding you to call and schedule your appointment. If you don't receive this letter, please contact our office.   Your physician recommends that you continue on your current medications as directed. Please refer to the Current Medication list given to you today.  PLEASE BRING MEDICATION BOTTLES AT [facility] VISIT.

## 2013-06-26 NOTE — Assessment & Plan Note (Signed)
Good control of BP. He is now taking lasix PRN.  Will continue current meds.

## 2013-06-26 NOTE — Progress Notes (Signed)
HPI: [name] is a 72 year old patient of Dr. Dietrich Pates were following for ongoing assessment and management of atrial fibrillation, hypertension chronic dyspnea. His last in the office in June of 2014 he was stable at that time from a cardiovascular standpoint her lisinopril was discontinued secondary to mild hypotension, and Lasix was decreased to when necessary. The patient was continued on Coumadin therapy. Dr. Margo Aye manages dosing.   He comes today without complaints.States he feels better than he has in years. He denies progressive shortness of breath or dizziness. He denies bleeding.   No Known Allergies  Current Outpatient Prescriptions  Medication Sig Dispense Refill  . diclofenac sodium (VOLTAREN) 1 % GEL Apply 1 application topically 3 (three) times daily as needed (For arthritis pain.).  100 g  0  . diltiazem (DILACOR XR) 180 MG 24 hr capsule Take 1 capsule (180 mg total) by mouth daily.  30 capsule  5  . furosemide (LASIX) 20 MG tablet Take 1 tablet (20 mg total) by mouth daily as needed.  90 tablet  1  . HYDROcodone-acetaminophen (NORCO) 7.5-325 MG per tablet Take 1 tablet by mouth every 6 (six) hours as needed for pain.      . metoprolol (LOPRESSOR) 50 MG tablet Take 75 mg by mouth 2 (two) times daily.      Marland Kitchen omeprazole (PRILOSEC) 20 MG capsule Take 20 mg by mouth daily.      Marland Kitchen warfarin (COUMADIN) 1 MG tablet Take 1 tablet (1 mg total) by mouth daily.      Marland Kitchen warfarin (COUMADIN) 5 MG tablet Take 1 tablet (5 mg total) by mouth daily.       No current facility-administered medications for this visit.    Past Medical History  Diagnosis Date  . Pulmonary embolism 2003, 2014    Recurrent pulmonary embolism with subtherapeutic INR in 12/2012  . Hypertension   . Gastroesophageal reflux     Frequent pyrosis  . Degenerative joint disease of knee     Bilateral; also hips  . Leg edema, right Chronic    Status post venectomy 1990s  . Allergic reaction 06/17/2012    Oral edema;  possibly food related  . Atrial fibrillation <2011    Arrhythmia of uncertain duration, but it least one year; additional records pending as of 03/2013  . Chronic anticoagulation   . Anemia     05/2011: H&H-10.2/31.3, MCV-92  . Chronic kidney disease     05/2011: BUN/Cr- 15/1.24, GFR-68  . Erectile dysfunction   . Benign prostatic hypertrophy   . Insomnia     Difficulty falling asleep  . Tobacco abuse, in remission     40 pack years; discontinued 2006; office spirometry in 2012: Nl ex FEF 25-75%    Past Surgical History  Procedure Laterality Date  . Varicose vein surgery  1970s  . Colonoscopy  2011    Negative screening study    ION:GEXBMW of systems complete and found to be negative unless listed above  PHYSICAL EXAM BP 110/50  Pulse 50  Ht 5\' 11"  (1.803 m)  Wt 173 lb (78.472 kg)  BMI 24.14 kg/m2  SpO2 97% General: Well developed, well nourished, in no acute distress Head: Eyes PERRLA, left eye unfocused deviating to the left. No xanthomas.   Normal cephalic and atramatic  Lungs: Clear bilaterally to auscultation and percussion. Heart: HRIR S1 S2, without MRG.  Pulses are 2+& equal.            No carotid bruit. No  JVD.  No abdominal bruits. No femoral bruits. Abdomen: Bowel sounds are positive, abdomen soft and non-tender without masses or                  Hernia's noted. Msk:  Back normal, normal gait. Normal strength and tone for age. Extremities: No clubbing, cyanosis or edema.  DP +1 Neuro: Alert and oriented X 3. Psych:  Good affect, responds appropriately    ASSESSMENT AND PLAN

## 2013-12-22 ENCOUNTER — Ambulatory Visit: Payer: Medicare Other | Admitting: Urology

## 2014-01-12 ENCOUNTER — Other Ambulatory Visit: Payer: Self-pay | Admitting: Adult Health

## 2014-03-16 ENCOUNTER — Other Ambulatory Visit: Payer: Self-pay | Admitting: Adult Health

## 2014-04-02 ENCOUNTER — Ambulatory Visit: Payer: Medicare Other | Admitting: Adult Health

## 2014-05-07 ENCOUNTER — Encounter: Payer: Self-pay | Admitting: *Deleted

## 2014-07-13 ENCOUNTER — Telehealth: Payer: Self-pay | Admitting: Adult Health

## 2014-07-13 NOTE — Telephone Encounter (Signed)
Received fax refill request  Rx # [id number] Medication:  Metoprolol 50 mg tablet Qty 90 Sig:  Take 1 and 1/2 tablets by mouth 2 times daily Physician:  Lyman Bishop

## 2014-08-12 ENCOUNTER — Other Ambulatory Visit: Payer: Self-pay | Admitting: Adult Health

## 2014-08-17 ENCOUNTER — Encounter: Payer: Self-pay | Admitting: *Deleted

## 2014-09-01 ENCOUNTER — Encounter: Payer: Self-pay | Admitting: *Deleted

## 2014-09-13 ENCOUNTER — Other Ambulatory Visit: Payer: Self-pay

## 2014-09-13 MED ORDER — DILTIAZEM HCL ER 180 MG PO CP24: 180.0000 mg | ORAL_CAPSULE | Freq: Every day | ORAL | Status: DC

## 2014-09-13 NOTE — Progress Notes (Signed)
Refill complete 

## 2014-12-07 ENCOUNTER — Encounter: Payer: Self-pay | Admitting: *Deleted

## 2014-12-15 DIAGNOSIS — Z7901 Long term (current) use of anticoagulants: Secondary | ICD-10-CM | POA: Diagnosis not present

## 2015-01-12 DIAGNOSIS — I482 Chronic atrial fibrillation: Secondary | ICD-10-CM | POA: Diagnosis not present

## 2015-01-12 DIAGNOSIS — Z7901 Long term (current) use of anticoagulants: Secondary | ICD-10-CM | POA: Diagnosis not present

## 2015-02-09 DIAGNOSIS — I482 Chronic atrial fibrillation: Secondary | ICD-10-CM | POA: Diagnosis not present

## 2015-02-09 DIAGNOSIS — Z7901 Long term (current) use of anticoagulants: Secondary | ICD-10-CM | POA: Diagnosis not present

## 2015-03-09 DIAGNOSIS — Z7901 Long term (current) use of anticoagulants: Secondary | ICD-10-CM | POA: Diagnosis not present

## 2015-03-09 DIAGNOSIS — I482 Chronic atrial fibrillation: Secondary | ICD-10-CM | POA: Diagnosis not present

## 2015-03-09 DIAGNOSIS — Z23 Encounter for immunization: Secondary | ICD-10-CM | POA: Diagnosis not present

## 2015-03-15 ENCOUNTER — Other Ambulatory Visit: Payer: Self-pay | Admitting: Adult Health

## 2015-04-20 DIAGNOSIS — I482 Chronic atrial fibrillation: Secondary | ICD-10-CM | POA: Diagnosis not present

## 2015-04-22 DIAGNOSIS — I482 Chronic atrial fibrillation: Secondary | ICD-10-CM | POA: Diagnosis not present

## 2015-05-14 ENCOUNTER — Other Ambulatory Visit: Payer: Self-pay | Admitting: Adult Health

## 2015-05-18 DIAGNOSIS — I482 Chronic atrial fibrillation: Secondary | ICD-10-CM | POA: Diagnosis not present

## 2015-06-10 ENCOUNTER — Other Ambulatory Visit: Payer: Self-pay | Admitting: Adult Health

## 2015-07-06 DIAGNOSIS — I482 Chronic atrial fibrillation: Secondary | ICD-10-CM | POA: Diagnosis not present

## 2015-07-06 DIAGNOSIS — Z23 Encounter for immunization: Secondary | ICD-10-CM | POA: Diagnosis not present

## 2015-08-09 DIAGNOSIS — I482 Chronic atrial fibrillation: Secondary | ICD-10-CM | POA: Diagnosis not present

## 2015-08-09 DIAGNOSIS — G894 Chronic pain syndrome: Secondary | ICD-10-CM | POA: Diagnosis not present

## 2015-08-24 DIAGNOSIS — I482 Chronic atrial fibrillation: Secondary | ICD-10-CM | POA: Diagnosis not present

## 2015-08-24 DIAGNOSIS — M545 Low back pain: Secondary | ICD-10-CM | POA: Diagnosis not present

## 2015-08-24 DIAGNOSIS — M25569 Pain in unspecified knee: Secondary | ICD-10-CM | POA: Diagnosis not present

## 2015-08-24 DIAGNOSIS — M069 Rheumatoid arthritis, unspecified: Secondary | ICD-10-CM | POA: Diagnosis not present

## 2015-10-09 ENCOUNTER — Encounter (HOSPITAL_COMMUNITY): Payer: Self-pay | Admitting: Emergency Medicine

## 2015-10-09 ENCOUNTER — Emergency Department (HOSPITAL_COMMUNITY)
Admission: EM | Admit: 2015-10-09 | Discharge: 2015-10-09 | Disposition: A | Discharge status: Home / Self Care | Payer: Medicare Other | Attending: Emergency Medicine | Admitting: Emergency Medicine

## 2015-10-09 DIAGNOSIS — J029 Acute pharyngitis, unspecified: Secondary | ICD-10-CM | POA: Diagnosis not present

## 2015-10-09 DIAGNOSIS — Z86711 Personal history of pulmonary embolism: Secondary | ICD-10-CM | POA: Diagnosis not present

## 2015-10-09 DIAGNOSIS — R05 Cough: Secondary | ICD-10-CM | POA: Diagnosis not present

## 2015-10-09 DIAGNOSIS — H578 Other specified disorders of eye and adnexa: Secondary | ICD-10-CM | POA: Diagnosis present

## 2015-10-09 DIAGNOSIS — J3489 Other specified disorders of nose and nasal sinuses: Secondary | ICD-10-CM | POA: Insufficient documentation

## 2015-10-09 DIAGNOSIS — Z79899 Other long term (current) drug therapy: Secondary | ICD-10-CM | POA: Insufficient documentation

## 2015-10-09 DIAGNOSIS — Z7901 Long term (current) use of anticoagulants: Secondary | ICD-10-CM | POA: Insufficient documentation

## 2015-10-09 DIAGNOSIS — Z862 Personal history of diseases of the blood and blood-forming organs and certain disorders involving the immune mechanism: Secondary | ICD-10-CM | POA: Diagnosis not present

## 2015-10-09 DIAGNOSIS — K219 Gastro-esophageal reflux disease without esophagitis: Secondary | ICD-10-CM | POA: Diagnosis not present

## 2015-10-09 DIAGNOSIS — Z8739 Personal history of other diseases of the musculoskeletal system and connective tissue: Secondary | ICD-10-CM | POA: Diagnosis not present

## 2015-10-09 DIAGNOSIS — N189 Chronic kidney disease, unspecified: Secondary | ICD-10-CM | POA: Diagnosis not present

## 2015-10-09 DIAGNOSIS — I4891 Unspecified atrial fibrillation: Secondary | ICD-10-CM | POA: Diagnosis not present

## 2015-10-09 DIAGNOSIS — H1131 Conjunctival hemorrhage, right eye: Secondary | ICD-10-CM

## 2015-10-09 DIAGNOSIS — I129 Hypertensive chronic kidney disease with stage 1 through stage 4 chronic kidney disease, or unspecified chronic kidney disease: Secondary | ICD-10-CM | POA: Diagnosis not present

## 2015-10-09 DIAGNOSIS — Z87891 Personal history of nicotine dependence: Secondary | ICD-10-CM | POA: Diagnosis not present

## 2015-10-09 LAB — PROTIME-INR
INR: 2.85 — AB (ref 0.00–1.49)
PROTHROMBIN TIME: 29.4 s — AB (ref 11.6–15.2)

## 2015-10-09 NOTE — ED Provider Notes (Signed)
CSN: [id number]     Arrival date & time 10/09/15  0258 History   First MD Initiated Contact with Patient 10/09/15 1021     Chief Complaint  Patient presents with  . Eye Problem     (Consider location/radiation/quality/duration/timing/severity/associated sxs/prior Treatment) The history is provided by the patient.   Allen Marshall is a 74 y.o. male presenting for evaluation of right eye redness which he woke with this am.  He reports feeling a mild irritated feeling in the eye when he woke this am and when he went to look in the mirror, was shocked to see the white of his eye was completely red.  He denies injury and has had no changes in his visual acuity.  He wears glasses for reading only.  He has had no recent forceful activities, denies sneezing, coughing, vomiting.  He takes coumadin for history of PE and his dose was increased to 5 mg then 7.5 mg qod 2 weeks ago from being subtherapeutic.  He denies any other bleeding or bruising.    Past Medical History  Diagnosis Date  . Pulmonary embolism (HCC) 2003, 2014    Recurrent pulmonary embolism with subtherapeutic INR in 12/2012  . Hypertension   . Gastroesophageal reflux     Frequent pyrosis  . Degenerative joint disease of knee     Bilateral; also hips  . Leg edema, right Chronic    Status post venectomy 1990s  . Allergic reaction 06/17/2012    Oral edema; possibly food related  . Atrial fibrillation (HCC) <2011    Arrhythmia of uncertain duration, but it least one year; additional records pending as of 03/2013  . Chronic anticoagulation   . Anemia     05/2011: H&H-10.2/31.3, MCV-92  . Chronic kidney disease     05/2011: BUN/Cr- 15/1.24, GFR-68  . Erectile dysfunction   . Benign prostatic hypertrophy   . Insomnia     Difficulty falling asleep  . Tobacco abuse, in remission     40 pack years; discontinued 2006; office spirometry in 2012: Nl ex FEF 25-75%   Past Surgical History  Procedure Laterality Date  . Varicose vein  surgery  1970s  . Colonoscopy  2011    Negative screening study   Family History  Problem Relation Age of Onset  . Atrial fibrillation Mother     Possible history of arrhythmia-patient recalls only palpitations  . Alzheimer's disease Father    Social History  Substance Use Topics  . Smoking status: Former Smoker -- 1.00 packs/day for 40 years  . Smokeless tobacco: Former Neurosurgeon    Quit date: 05/15/2005  . Alcohol Use: No    Review of Systems  Constitutional: Negative for fever and chills.  HENT: Positive for rhinorrhea and sore throat. Negative for congestion, ear pain, sinus pressure, trouble swallowing and voice change.   Eyes: Positive for redness. Negative for pain, discharge and visual disturbance.  Respiratory: Positive for cough. Negative for shortness of breath, wheezing and stridor.   Cardiovascular: Negative for chest pain.  Gastrointestinal: Negative for abdominal pain and blood in stool.  Genitourinary: Negative.  Negative for hematuria.  Neurological: Negative for headaches.  Hematological: Does not bruise/bleed easily.      Allergies  Review of patient's allergies indicates no known allergies.  Home Medications   Prior to Admission medications   Medication Sig Start Date End Date Taking? Authorizing Provider  diltiazem (DILACOR XR) 180 MG 24 hr capsule Take 1 capsule (180 mg total) by mouth daily. 09/13/14  Yes Jodelle Gross, NP  furosemide (LASIX) 20 MG tablet Take 1 tablet (20 mg total) by mouth daily as needed. 06/03/13  Yes Kathlen Brunswick, MD  metoprolol (LOPRESSOR) 50 MG tablet TAKE ONE AND ONE HALF TABLET BY MOUTH TWICE DAILY. 03/15/15  Yes Jodelle Gross, NP  omeprazole (PRILOSEC) 20 MG capsule Take 20 mg by mouth daily.   Yes Historical Provider, MD  warfarin (COUMADIN) 1 MG tablet Take 1 tablet (1 mg total) by mouth daily. 12/30/12  Yes Christiane Ha, MD  warfarin (COUMADIN) 5 MG tablet Take 1 tablet (5 mg total) by mouth daily. 12/30/12   Yes Christiane Ha, MD   BP 129/83 mmHg  Pulse 70  Temp(Src) 97.6 F (36.4 C) (Oral)  Resp 14  Ht 5\' 11"  (1.803 m)  Wt 198 lb (89.812 kg)  BMI 27.63 kg/m2  SpO2 98% Physical Exam  Constitutional: He is oriented to person, place, and time. He appears well-developed and well-nourished. No distress.  HENT:  Head: Normocephalic and atraumatic.  Right Ear: Tympanic membrane and external ear normal.  Left Ear: Tympanic membrane and external ear normal.  Mouth/Throat: Oropharynx is clear and moist and mucous membranes are normal. No oral lesions. No trismus in the jaw.  Eyes: EOM are normal. Pupils are equal, round, and reactive to light. Right conjunctiva has a hemorrhage.  Right conjunctiva with subconjunctival hematoma affecting inferior, medial and lateral sclera, not involving superior sclera.  No significant edema.    Neck: Normal range of motion. Neck supple.  Cardiovascular: Normal rate and normal heart sounds.   Pulmonary/Chest: Effort normal.  Abdominal: He exhibits no distension.  Musculoskeletal: Normal range of motion.  Lymphadenopathy:    He has no cervical adenopathy.  Neurological: He is alert and oriented to person, place, and time.  Skin: Skin is warm and dry. No erythema.  Psychiatric: He has a normal mood and affect.    ED Course  Procedures (including critical care time) Labs Review Labs Reviewed  PROTIME-INR - Abnormal; Notable for the following:    Prothrombin Time 29.4 (*)    INR 2.85 (*)    All other components within normal limits    Imaging Review No results found. I have personally reviewed and evaluated these images and lab results as part of my medical decision-making.   EKG Interpretation None      MDM   Final diagnoses:  Subconjunctival hemorrhage of right eye    INR reviewed and therapeutic.  Pt advised cool compresses, avoid rubbing the eye.  Call pcp to advise of his INR tomorrow and for guidance on when he should get his next  INR checked (currently scheduled for 10 days from now).  Also, referral to Dr or return here for any worsened sx.  Advised this blood/bruising should slowly resolve over the next 10-14 days.    The patient appears reasonably screened and/or stabilized for discharge and I doubt any other medical condition or other [facility] requiring further screening, evaluation, or treatment in the ED at this time prior to discharge.   Discussed with Dr [facility] prior to dc home.    Clarene Duke, PA-C 10/09/15 1811  10/11/15, DO 10/12/15 1426

## 2015-10-09 NOTE — ED Notes (Signed)
Patient given discharge instruction, verbalized understand. Patient ambulatory out of the department.  

## 2015-10-09 NOTE — ED Notes (Signed)
PA at the bedside.

## 2015-10-09 NOTE — ED Notes (Signed)
Pt c/o of redness to right eye. Pt states he woke up and thought something was in his eye but noticed his eye was "blood shot". Pt denies V/, coughing, or pain to the eye. Pt denies any vision changes.

## 2015-10-09 NOTE — Discharge Instructions (Signed)
Subconjunctival Hemorrhage Subconjunctival hemorrhage is bleeding that happens between the white part of your eye (sclera) and the clear membrane that covers the outside of your eye (conjunctiva). There are many tiny blood vessels near the surface of your eye. A subconjunctival hemorrhage happens when one or more of these vessels breaks and bleeds, causing a red patch to appear on your eye. This is similar to a bruise. Depending on the amount of bleeding, the red patch may only cover a small area of your eye or it may cover the entire visible part of the sclera. If a lot of blood collects under the conjunctiva, there may also be swelling. Subconjunctival hemorrhages do not affect your vision or cause pain, but your eye may feel irritated if there is swelling. Subconjunctival hemorrhages usually do not require treatment, and they disappear on their own within two weeks. CAUSES This condition may be caused by:  Mild trauma, such as rubbing your eye too hard.  Severe trauma or blunt injuries.  Coughing, sneezing, or vomiting.  Straining, such as when lifting a heavy object.  High blood pressure.  Recent eye surgery.  A history of diabetes.  Certain medicines, especially blood thinners (anticoagulants).  Other conditions, such as eye tumors, bleeding disorders, or blood vessel abnormalities. Subconjunctival hemorrhages can happen without an obvious cause.  SYMPTOMS  Symptoms of this condition include:  A bright red or dark red patch on the white part of the eye.  The red area may spread out to cover a larger area of the eye before it goes away.  The red area may turn brownish-yellow before it goes away.  Swelling.  Mild eye irritation. DIAGNOSIS This condition is diagnosed with a physical exam. If your subconjunctival hemorrhage was caused by trauma, your health care provider may refer you to an eye specialist (ophthalmologist) or another specialist to check for other injuries. You  may have other tests, including:  An eye exam.  A blood pressure check.  Blood tests to check for bleeding disorders. If your subconjunctival hemorrhage was caused by trauma, X-rays or a CT scan may be done to check for other injuries. TREATMENT Usually, no treatment is needed. Your health care provider may recommend eye drops or cold compresses to help with discomfort. HOME CARE INSTRUCTIONS  Take over-the-counter and prescription medicines only as directed by your health care provider.  Use eye drops or cold compresses to help with discomfort as directed by your health care provider.  Avoid activities, things, and environments that may irritate or injure your eye.  Keep all follow-up visits as told by your health care provider. This is important. SEEK MEDICAL CARE IF:  You have pain in your eye.  The bleeding does not go away within 3 weeks.  You keep getting new subconjunctival hemorrhages. SEEK IMMEDIATE MEDICAL CARE IF:  Your vision changes or you have difficulty seeing.  You suddenly develop severe sensitivity to light.  You develop a severe headache, persistent vomiting, confusion, or abnormal tiredness (lethargy).  Your eye seems to bulge or protrude from your eye socket.  You develop unexplained bruises on your body.  You have unexplained bleeding in another area of your body.   This information is not intended to replace advice given to you by your health care provider. Make sure you discuss any questions you have with your health care provider.   Document Released: 11/26/2005 Document Revised: 08/17/2015 Document Reviewed: 02/02/2015 Elsevier Interactive Patient Education [employer]    Please continue taking  your coumadin at your increased dose, but call your doctor tomorrow to advise him that your INR is 2.85 today.  This is good, but you do not want it getting much higher (3.0 is the highest you want it to be).  He may want to repeat your INR near  the end of this week.  Use cool compresses and avoid rubbing your eye.  Followup with an eye doctor if your symptoms worsen, see the referral above.  In the interim, return here for any worsened symptoms.

## 2015-10-09 NOTE — ED Notes (Signed)
Applied cold compress to pt eye

## 2015-10-19 DIAGNOSIS — M545 Low back pain: Secondary | ICD-10-CM | POA: Diagnosis not present

## 2015-10-19 DIAGNOSIS — Z23 Encounter for immunization: Secondary | ICD-10-CM | POA: Diagnosis not present

## 2015-10-19 DIAGNOSIS — I482 Chronic atrial fibrillation: Secondary | ICD-10-CM | POA: Diagnosis not present

## 2015-10-19 DIAGNOSIS — M25569 Pain in unspecified knee: Secondary | ICD-10-CM | POA: Diagnosis not present

## 2015-11-16 DIAGNOSIS — M545 Low back pain: Secondary | ICD-10-CM | POA: Diagnosis not present

## 2015-11-16 DIAGNOSIS — M25569 Pain in unspecified knee: Secondary | ICD-10-CM | POA: Diagnosis not present

## 2015-11-16 DIAGNOSIS — I482 Chronic atrial fibrillation: Secondary | ICD-10-CM | POA: Diagnosis not present

## 2015-12-14 DIAGNOSIS — M25569 Pain in unspecified knee: Secondary | ICD-10-CM | POA: Diagnosis not present

## 2015-12-14 DIAGNOSIS — I482 Chronic atrial fibrillation: Secondary | ICD-10-CM | POA: Diagnosis not present

## 2015-12-14 DIAGNOSIS — M545 Low back pain: Secondary | ICD-10-CM | POA: Diagnosis not present

## 2016-01-11 DIAGNOSIS — M25569 Pain in unspecified knee: Secondary | ICD-10-CM | POA: Diagnosis not present

## 2016-01-11 DIAGNOSIS — I482 Chronic atrial fibrillation: Secondary | ICD-10-CM | POA: Diagnosis not present

## 2016-01-11 DIAGNOSIS — M545 Low back pain: Secondary | ICD-10-CM | POA: Diagnosis not present

## 2016-02-08 DIAGNOSIS — M25569 Pain in unspecified knee: Secondary | ICD-10-CM | POA: Diagnosis not present

## 2016-02-08 DIAGNOSIS — I482 Chronic atrial fibrillation: Secondary | ICD-10-CM | POA: Diagnosis not present

## 2016-02-08 DIAGNOSIS — M545 Low back pain: Secondary | ICD-10-CM | POA: Diagnosis not present

## 2016-02-08 DIAGNOSIS — Z125 Encounter for screening for malignant neoplasm of prostate: Secondary | ICD-10-CM | POA: Diagnosis not present

## 2016-02-08 DIAGNOSIS — K219 Gastro-esophageal reflux disease without esophagitis: Secondary | ICD-10-CM | POA: Diagnosis not present

## 2016-03-21 DIAGNOSIS — M545 Low back pain: Secondary | ICD-10-CM | POA: Diagnosis not present

## 2016-03-21 DIAGNOSIS — R972 Elevated prostate specific antigen [PSA]: Secondary | ICD-10-CM | POA: Diagnosis not present

## 2016-03-21 DIAGNOSIS — I482 Chronic atrial fibrillation: Secondary | ICD-10-CM | POA: Diagnosis not present

## 2016-04-23 ENCOUNTER — Emergency Department (HOSPITAL_COMMUNITY)
Admission: EM | Admit: 2016-04-23 | Discharge: 2016-04-24 | Disposition: A | Discharge status: Home / Self Care | Payer: Commercial Managed Care - HMO | Attending: Emergency Medicine | Admitting: Emergency Medicine

## 2016-04-23 ENCOUNTER — Encounter (HOSPITAL_COMMUNITY): Payer: Self-pay | Admitting: Emergency Medicine

## 2016-04-23 DIAGNOSIS — Z7901 Long term (current) use of anticoagulants: Secondary | ICD-10-CM | POA: Insufficient documentation

## 2016-04-23 DIAGNOSIS — I4891 Unspecified atrial fibrillation: Secondary | ICD-10-CM | POA: Diagnosis not present

## 2016-04-23 DIAGNOSIS — I10 Essential (primary) hypertension: Secondary | ICD-10-CM | POA: Insufficient documentation

## 2016-04-23 DIAGNOSIS — L02214 Cutaneous abscess of groin: Secondary | ICD-10-CM

## 2016-04-23 DIAGNOSIS — Z79899 Other long term (current) drug therapy: Secondary | ICD-10-CM | POA: Insufficient documentation

## 2016-04-23 DIAGNOSIS — Z87891 Personal history of nicotine dependence: Secondary | ICD-10-CM | POA: Insufficient documentation

## 2016-04-23 MED ORDER — SULFAMETHOXAZOLE-TRIMETHOPRIM 200-40 MG/5ML PO SUSP: 20.0000 mL | Freq: Once | ORAL | Status: DC

## 2016-04-23 MED ORDER — SULFAMETHOXAZOLE-TRIMETHOPRIM 800-160 MG PO TABS: 1.0000 | ORAL_TABLET | Freq: Two times a day (BID) | ORAL | Status: AC

## 2016-04-23 NOTE — Discharge Instructions (Signed)

## 2016-04-23 NOTE — ED Notes (Addendum)
Pt reports abscess started as a bump w/ a head about 4 days ago just above the groin. Since has scabbed over & bleeds some. Pt denies running any fevers.

## 2016-04-23 NOTE — ED Provider Notes (Signed)
CSN: [id number]     Arrival date & time 04/23/16  2141 History  By signing my name below, I, Marisue Humble, attest that this documentation has been prepared under the direction and in the presence of Eber Hong, MD . Electronically Signed: Marisue Humble, Scribe. 04/23/2016. 11:37 PM.   Chief Complaint  Patient presents with  . Abscess   The history is provided by the patient. No language interpreter was used.   HPI Comments:  Allen Marshall is a 75 y.o. male who presents to the Emergency Department complaining of bleeding abscess onset 4 days ago. Pt states the abscess is not painful and is draining blood without pus. He has shaved around the abscess and applied a bandage. He has had "blisters" on his neck, inner leg and lower back previously that burned, itched and drained on their own. Pt has not been sexually active for 4 years. Pt takes Coumadin s/p PE. Denies h/o DM, fever, chills, penile discharge, recent h/o STD's or recent travel.  Past Medical History  Diagnosis Date  . Pulmonary embolism (HCC) 2003, 2014    Recurrent pulmonary embolism with subtherapeutic INR in 12/2012  . Hypertension   . Gastroesophageal reflux     Frequent pyrosis  . Degenerative joint disease of knee     Bilateral; also hips  . Leg edema, right Chronic    Status post venectomy 1990s  . Allergic reaction 06/17/2012    Oral edema; possibly food related  . Atrial fibrillation (HCC) <2011    Arrhythmia of uncertain duration, but it least one year; additional records pending as of 03/2013  . Chronic anticoagulation   . Anemia     05/2011: H&H-10.2/31.3, MCV-92  . Chronic kidney disease     05/2011: BUN/Cr- 15/1.24, GFR-68  . Erectile dysfunction   . Benign prostatic hypertrophy   . Insomnia     Difficulty falling asleep  . Tobacco abuse, in remission     40 pack years; discontinued 2006; office spirometry in 2012: Nl ex FEF 25-75%   Past Surgical History  Procedure Laterality Date  . Varicose vein  surgery  1970s  . Colonoscopy  2011    Negative screening study   Family History  Problem Relation Age of Onset  . Atrial fibrillation Mother     Possible history of arrhythmia-patient recalls only palpitations  . Alzheimer's disease Father    Social History  Substance Use Topics  . Smoking status: Former Smoker -- 1.00 packs/day for 40 years  . Smokeless tobacco: Former Neurosurgeon    Quit date: 05/15/2005  . Alcohol Use: No    Review of Systems  Constitutional: Negative for fever and chills.  Genitourinary: Negative for discharge.  Skin: Positive for wound.   Allergies  Review of patient's allergies indicates no known allergies.  Home Medications   Prior to Admission medications   Medication Sig Start Date End Date Taking? Authorizing Provider  diltiazem (DILACOR XR) 180 MG 24 hr capsule Take 1 capsule (180 mg total) by mouth daily. 09/13/14   Jodelle Gross, NP  furosemide (LASIX) 20 MG tablet Take 1 tablet (20 mg total) by mouth daily as needed. 06/03/13   Kathlen Brunswick, MD  metoprolol (LOPRESSOR) 50 MG tablet TAKE ONE AND ONE HALF TABLET BY MOUTH TWICE DAILY. 03/15/15   Jodelle Gross, NP  omeprazole (PRILOSEC) 20 MG capsule Take 20 mg by mouth daily.    Historical Provider, MD  sulfamethoxazole-trimethoprim (BACTRIM DS,SEPTRA DS) 800-160 MG tablet Take 1 tablet  by mouth 2 (two) times daily. 04/23/16 04/30/16  Eber Hong, MD  warfarin (COUMADIN) 1 MG tablet Take 1 tablet (1 mg total) by mouth daily. 12/30/12   Christiane Ha, MD  warfarin (COUMADIN) 5 MG tablet Take 1 tablet (5 mg total) by mouth daily. 12/30/12   Christiane Ha, MD   BP 115/61 mmHg  Pulse 59  Temp(Src) 98.3 F (36.8 C) (Oral)  Resp 20  Ht 5\' 11"  (1.803 m)  Wt 198 lb (89.812 kg)  BMI 27.63 kg/m2  SpO2 100% Physical Exam  Constitutional: He appears well-developed and well-nourished.  HENT:  Head: Normocephalic and atraumatic.  Eyes: Conjunctivae are normal. Right eye exhibits no  discharge. Left eye exhibits no discharge.  Pulmonary/Chest: Effort normal. No respiratory distress.  Genitourinary:  1cm diameter pustular opening in mons area with purulent base; no surrounding erythema; normal appearing penis and testicles   Neurological: He is alert. Coordination normal.  Skin: Skin is warm and dry. No rash noted. He is not diaphoretic. No erythema.  Psychiatric: He has a normal mood and affect.  Nursing note and vitals reviewed.   ED Course  Procedures  DIAGNOSTIC STUDIES:  Oxygen Saturation is 100% on RA, normal by my interpretation.    COORDINATION OF CARE:  11:34 PM Will apply dressing prior to discharge and will start pt on antibiotics. Discussed treatment plan with pt at bedside and pt agreed to plan.  Labs Review Labs Reviewed - No data to display  Imaging Review No results found. I have personally reviewed and evaluated these images and lab results as part of my medical decision-making.    MDM   Final diagnoses:  Abscess, groin    Debrided the abscess - crusting and bleeding over the abscess - it is very open, purulent draining spontaneously - having no surrounding redness, use abx, dressing changes - some bleeding risk due to coumadin but overall well appearing and not having any uncontrolled bleeding.  Meds given in ED:  Medications  sulfamethoxazole-trimethoprim (BACTRIM DS,SEPTRA DS) 800-160 MG per tablet 1 tablet (1 tablet Oral Given 04/24/16 0020)    Discharge Medication List as of 04/23/2016 11:59 PM    START taking these medications   Details  sulfamethoxazole-trimethoprim (BACTRIM DS,SEPTRA DS) 800-160 MG tablet Take 1 tablet by mouth 2 (two) times daily., Starting 04/23/2016, Until Mon 04/30/16, Print          05/02/16, MD 04/25/16 [phone]

## 2016-04-23 NOTE — ED Notes (Signed)
Patient complaining of sore to groin that is bleeding and scabbing over. States sore has been there for 4 days.

## 2016-04-24 MED ORDER — SULFAMETHOXAZOLE-TRIMETHOPRIM 800-160 MG PO TABS
1.0000 | ORAL_TABLET | Freq: Once | ORAL | Status: AC
  Administered 2016-04-24: 1 via ORAL
  Filled 2016-04-24: qty 1

## 2016-04-24 NOTE — ED Notes (Signed)
Pt alert & oriented x4, stable gait. Patient given discharge instructions, paperwork & prescription(s). Patient  instructed to stop at the registration desk to finish any additional paperwork. Patient verbalized understanding. Pt left department w/ no further questions. 

## 2016-04-26 DIAGNOSIS — L02211 Cutaneous abscess of abdominal wall: Secondary | ICD-10-CM | POA: Diagnosis not present

## 2016-07-23 DIAGNOSIS — I10 Essential (primary) hypertension: Secondary | ICD-10-CM | POA: Diagnosis not present

## 2016-07-23 DIAGNOSIS — I482 Chronic atrial fibrillation: Secondary | ICD-10-CM | POA: Diagnosis not present

## 2016-08-15 DIAGNOSIS — I10 Essential (primary) hypertension: Secondary | ICD-10-CM | POA: Diagnosis not present

## 2016-08-15 DIAGNOSIS — I482 Chronic atrial fibrillation: Secondary | ICD-10-CM | POA: Diagnosis not present

## 2016-08-15 DIAGNOSIS — Z7901 Long term (current) use of anticoagulants: Secondary | ICD-10-CM | POA: Diagnosis not present

## 2016-08-15 DIAGNOSIS — Z23 Encounter for immunization: Secondary | ICD-10-CM | POA: Diagnosis not present

## 2016-09-12 DIAGNOSIS — Z7901 Long term (current) use of anticoagulants: Secondary | ICD-10-CM | POA: Diagnosis not present

## 2016-09-12 DIAGNOSIS — Z6827 Body mass index (BMI) 27.0-27.9, adult: Secondary | ICD-10-CM | POA: Diagnosis not present

## 2016-09-12 DIAGNOSIS — I482 Chronic atrial fibrillation: Secondary | ICD-10-CM | POA: Diagnosis not present

## 2016-09-12 DIAGNOSIS — R001 Bradycardia, unspecified: Secondary | ICD-10-CM | POA: Diagnosis not present

## 2016-09-12 DIAGNOSIS — I10 Essential (primary) hypertension: Secondary | ICD-10-CM | POA: Diagnosis not present

## 2016-10-13 ENCOUNTER — Encounter (HOSPITAL_COMMUNITY): Payer: Self-pay | Admitting: Emergency Medicine

## 2016-10-13 ENCOUNTER — Emergency Department (HOSPITAL_COMMUNITY)
Admission: EM | Admit: 2016-10-13 | Discharge: 2016-10-13 | Disposition: A | Discharge status: Home / Self Care | Payer: Commercial Managed Care - HMO | Attending: Emergency Medicine | Admitting: Emergency Medicine

## 2016-10-13 DIAGNOSIS — Z79899 Other long term (current) drug therapy: Secondary | ICD-10-CM | POA: Insufficient documentation

## 2016-10-13 DIAGNOSIS — I129 Hypertensive chronic kidney disease with stage 1 through stage 4 chronic kidney disease, or unspecified chronic kidney disease: Secondary | ICD-10-CM | POA: Diagnosis not present

## 2016-10-13 DIAGNOSIS — Z87891 Personal history of nicotine dependence: Secondary | ICD-10-CM | POA: Insufficient documentation

## 2016-10-13 DIAGNOSIS — Z7901 Long term (current) use of anticoagulants: Secondary | ICD-10-CM | POA: Diagnosis not present

## 2016-10-13 DIAGNOSIS — N492 Inflammatory disorders of scrotum: Secondary | ICD-10-CM | POA: Diagnosis not present

## 2016-10-13 DIAGNOSIS — N189 Chronic kidney disease, unspecified: Secondary | ICD-10-CM | POA: Diagnosis not present

## 2016-10-13 DIAGNOSIS — L0291 Cutaneous abscess, unspecified: Secondary | ICD-10-CM

## 2016-10-13 LAB — PROTIME-INR
INR: 3.22
Prothrombin Time: 33.6 seconds — ABNORMAL HIGH (ref 11.4–15.2)

## 2016-10-13 MED ORDER — CEPHALEXIN 500 MG PO CAPS: 500.0000 mg | ORAL_CAPSULE | Freq: Four times a day (QID) | ORAL | 0 refills | Status: DC

## 2016-10-13 NOTE — ED Provider Notes (Signed)
[facility] DEPT Provider Note   CSN: [id number] Arrival date & time: 10/13/16  0806     History   Chief Complaint Chief Complaint  Patient presents with  . Abscess    HPI Allen Marshall is a 75 y.o. male.  The history is provided by the patient. No language interpreter was used.  Abscess  Abscess location: right scrotum. Size:  2 cm Abscess quality: draining, itching and redness   Red streaking: no   Duration:  4 days Progression:  Unchanged Chronicity:  New Context: not diabetes   Relieved by:  Nothing Worsened by:  Nothing Ineffective treatments:  None tried Associated symptoms: no nausea and no vomiting   Pt complains of getting frequent abscesses over the last year.  Pt reports he has had on arms and groin.  Pt reports he was treated with antibiotics for the last one he had.   Past Medical History:  Diagnosis Date  . Allergic reaction 06/17/2012   Oral edema; possibly food related  . Anemia    05/2011: H&H-10.2/31.3, MCV-92  . Atrial fibrillation (HCC) <2011   Arrhythmia of uncertain duration, but it least one year; additional records pending as of 03/2013  . Benign prostatic hypertrophy   . Chronic anticoagulation   . Chronic kidney disease    05/2011: BUN/Cr- 15/1.24, GFR-68  . Degenerative joint disease of knee    Bilateral; also hips  . Erectile dysfunction   . Gastroesophageal reflux    Frequent pyrosis  . Hypertension   . Insomnia    Difficulty falling asleep  . Leg edema, right Chronic   Status post venectomy 1990s  . Pulmonary embolism (HCC) 2003, 2014   Recurrent pulmonary embolism with subtherapeutic INR in 12/2012  . Tobacco abuse, in remission    40 pack years; discontinued 2006; office spirometry in 2012: Nl ex FEF 25-75%    Patient Active Problem List   Diagnosis Date Noted  . Tobacco abuse, in remission   . Cardiomyopathy-ruled out 04/12/2013  . Pulmonary embolism (HCC)   . Hypertension   . Degenerative joint disease of knee   .  Atrial fibrillation (HCC)   . Gastroesophageal reflux   . Chronic anticoagulation 12/26/2012    Past Surgical History:  Procedure Laterality Date  . COLONOSCOPY  2011   Negative screening study  . VARICOSE VEIN SURGERY  1970s       Home Medications    Prior to Admission medications   Medication Sig Start Date End Date Taking? Authorizing Provider  diltiazem (DILACOR XR) 180 MG 24 hr capsule Take 1 capsule (180 mg total) by mouth daily. 09/13/14   Jodelle Gross, NP  furosemide (LASIX) 20 MG tablet Take 1 tablet (20 mg total) by mouth daily as needed. 06/03/13   Kathlen Brunswick, MD  metoprolol (LOPRESSOR) 50 MG tablet TAKE ONE AND ONE HALF TABLET BY MOUTH TWICE DAILY. 03/15/15   Jodelle Gross, NP  omeprazole (PRILOSEC) 20 MG capsule Take 20 mg by mouth daily.    Historical Provider, MD  warfarin (COUMADIN) 1 MG tablet Take 1 tablet (1 mg total) by mouth daily. 12/30/12   Christiane Ha, MD  warfarin (COUMADIN) 5 MG tablet Take 1 tablet (5 mg total) by mouth daily. 12/30/12   Christiane Ha, MD    Family History Family History  Problem Relation Age of Onset  . Atrial fibrillation Mother     Possible history of arrhythmia-patient recalls only palpitations  . Alzheimer's disease Father  Social History Social History  Substance Use Topics  . Smoking status: Former Smoker    Packs/day: 1.00    Years: 40.00  . Smokeless tobacco: Former Neurosurgeon    Quit date: 05/15/2005  . Alcohol use No     Allergies   Review of patient's allergies indicates no known allergies.   Review of Systems Review of Systems  Gastrointestinal: Negative for nausea and vomiting.  All other systems reviewed and are negative.    Physical Exam Updated Vital Signs BP 168/85   Pulse 68   Temp 97.5 F (36.4 C)   Resp 18   Ht 5\' 11"  (1.803 m)   Wt 89.8 kg   SpO2 98%   BMI 27.62 kg/m   Physical Exam  Constitutional: He is oriented to person, place, and time. He appears  well-developed and well-nourished.  HENT:  Head: Normocephalic.  Eyes: EOM are normal.  Neck: Normal range of motion.  Pulmonary/Chest: Effort normal.  Abdominal: He exhibits no distension.  Musculoskeletal: Normal range of motion.  Neurological: He is alert and oriented to person, place, and time.  Skin:  1cm area of swelling open and draining.  Feels superficial.  No mass to testicle,  .    Psychiatric: He has a normal mood and affect.  Nursing note and vitals reviewed.    ED Treatments / Results  Labs (all labs ordered are listed, but only abnormal results are displayed) Labs Reviewed  AEROBIC CULTURE (SUPERFICIAL SPECIMEN)    EKG  EKG Interpretation None       Radiology No results found.  Procedures Procedures (including critical care time)  Medications Ordered in ED Medications - No data to display   Initial Impression / Assessment and Plan / ED Course  I have reviewed the triage vital signs and the nursing notes.  Pertinent labs & imaging results that were available during my care of the patient were reviewed by me and considered in my medical decision making (see chart for details).  Clinical Course    Pt denies having any knots to the area,  No testicle problems.  This area appears to be a superficial skin abscess that is draining.   I advised pt due to location he will need to see his Md for recheck to make sure area resolves. Pt denies current pain.   Final Clinical Impressions(s) / ED Diagnoses   Final diagnoses:  Abscess, scrotum    New Prescriptions New Prescriptions   CEPHALEXIN (KEFLEX) 500 MG CAPSULE    Take 1 capsule (500 mg total) by mouth 4 (four) times daily.  An After Visit Summary was printed and given to the patient. See Dr. for recheck in 3 days   Margo Aye, PA-C 10/13/16 13/04/17    1610, MD 10/13/16 [phone]

## 2016-10-13 NOTE — ED Triage Notes (Signed)
Abscess to right testicle x 3-4 days.

## 2016-10-13 NOTE — ED Provider Notes (Signed)
Patient with tiny abscess on scrotum which is spontaneously draining. He reports nonpainful. He's had multiple similar abscesses in the past. Patient appears in no distress. Scrotum there is a 1 cm abscess which appears superficial and is spontaneously draining. No surrounding tenderness or redness   Doug Sou, MD 10/13/16 [phone]

## 2016-10-16 LAB — AEROBIC CULTURE  (SUPERFICIAL SPECIMEN)

## 2016-10-16 LAB — AEROBIC CULTURE W GRAM STAIN (SUPERFICIAL SPECIMEN): Gram Stain: NONE SEEN

## 2016-10-17 ENCOUNTER — Telehealth (HOSPITAL_COMMUNITY): Payer: Self-pay

## 2016-10-17 NOTE — Telephone Encounter (Signed)
Post ED Visit - Positive Culture Follow-up: Successful Patient Follow-Up  Culture assessed and recommendations reviewed by: []  , Pharm.D. []  Enzo Bi, Pharm.D., BCPS []  , Pharm.D. []  Celedonio Miyamoto, Pharm.D., BCPS []  [place], Garvin Fila.D., BCPS, AAHIVP []  , Pharm.D., BCPS, AAHIVP []  Cassie Stewart, Pharm.D. []  Georgina Pillion, .D. [place], Pharm.D.  Positive culture, Abundant MRSA  []  Patient discharged without antimicrobial prescription and treatment is now indicated [x]  Organism is resistant to prescribed ED discharge antimicrobial(Keflex) []  Patient with positive blood cultures  Changes discussed with ED provider: [address] [place] New antibiotic prescription "Stop Keflex.  Start Doxycycline 100 mg po BID x 10 days"  Caution about interaction w/warfarin, will need to get INR checked w/PCP soon" Called to   Contacted patient, date 10/17/2016, time 0904 LVM requesting callback.  Letter sent to [facility] address.   10/17/2016, 9:05 AM

## 2016-10-17 NOTE — Progress Notes (Signed)
ED Antimicrobial Stewardship Positive Culture Follow Up   Allen Marshall is an 75 y.o. male who presented to [facility] on 10/13/2016 with a chief complaint of  Chief Complaint  Patient presents with  . Abscess    Recent Results (from the past 720 hour(s))  Wound or Superficial Culture     Status: None   Collection Time: 10/13/16  9:00 AM  Result Value Ref Range Status   Specimen Description SCROTUM  Final   Special Requests NONE  Final   Gram Stain   Final    NO WBC SEEN MODERATE GRAM POSITIVE COCCI IN PAIRS IN CLUSTERS RARE GRAM POSITIVE RODS Performed at [facility]    Culture   Final    ABUNDANT METHICILLIN RESISTANT STAPHYLOCOCCUS AUREUS   Report Status 10/16/2016 FINAL  Final   Organism ID, Bacteria METHICILLIN RESISTANT STAPHYLOCOCCUS AUREUS  Final      Susceptibility   Methicillin resistant staphylococcus aureus - MIC*    CIPROFLOXACIN >=8 RESISTANT Resistant     ERYTHROMYCIN >=8 RESISTANT Resistant     GENTAMICIN <=0.5 SENSITIVE Sensitive     OXACILLIN >=4 RESISTANT Resistant     TETRACYCLINE <=1 SENSITIVE Sensitive     VANCOMYCIN 1 SENSITIVE Sensitive     TRIMETH/SULFA <=10 SENSITIVE Sensitive     CLINDAMYCIN <=0.25 SENSITIVE Sensitive     RIFAMPIN <=0.5 SENSITIVE Sensitive     Inducible Clindamycin NEGATIVE Sensitive     * ABUNDANT METHICILLIN RESISTANT STAPHYLOCOCCUS AUREUS    [x]  Treated with cephalexin, organism resistant to prescribed antimicrobial. Of note, patient is on warfarin for AFib - many antibiotics interact and may cause an elevation in INR. Will caution patient to follow-up with PCP for INR check sooner rather than later.  New antibiotic prescription: Doxycycline 100mg  po BID x 10 days  ED Provider: , PA  , PharmD PGY1 Pharmacy Resident [phone] (Pager) 10/17/2016 8:34 AM

## 2016-10-17 NOTE — Telephone Encounter (Signed)
Pt returned call.  Pt informed of dx and need for addl tx.  Rx for Doxycycline called and given to [facility] [phone].  Pt informed to stop Keflex and to f/u w/PCP regarding INR.  Letter not sent.

## 2016-10-23 DIAGNOSIS — Z6827 Body mass index (BMI) 27.0-27.9, adult: Secondary | ICD-10-CM | POA: Diagnosis not present

## 2016-10-23 DIAGNOSIS — Z7901 Long term (current) use of anticoagulants: Secondary | ICD-10-CM | POA: Diagnosis not present

## 2016-10-23 DIAGNOSIS — R7301 Impaired fasting glucose: Secondary | ICD-10-CM | POA: Diagnosis not present

## 2016-10-23 DIAGNOSIS — E782 Mixed hyperlipidemia: Secondary | ICD-10-CM | POA: Diagnosis not present

## 2016-10-23 DIAGNOSIS — R001 Bradycardia, unspecified: Secondary | ICD-10-CM | POA: Diagnosis not present

## 2016-10-23 DIAGNOSIS — I10 Essential (primary) hypertension: Secondary | ICD-10-CM | POA: Diagnosis not present

## 2016-10-23 DIAGNOSIS — R42 Dizziness and giddiness: Secondary | ICD-10-CM | POA: Diagnosis not present

## 2016-10-23 DIAGNOSIS — R972 Elevated prostate specific antigen [PSA]: Secondary | ICD-10-CM | POA: Diagnosis not present

## 2016-10-23 DIAGNOSIS — R0602 Shortness of breath: Secondary | ICD-10-CM | POA: Diagnosis not present

## 2016-10-23 DIAGNOSIS — Z0000 Encounter for general adult medical examination without abnormal findings: Secondary | ICD-10-CM | POA: Diagnosis not present

## 2016-11-28 ENCOUNTER — Other Ambulatory Visit: Payer: Self-pay | Admitting: *Deleted

## 2016-11-28 NOTE — Patient Outreach (Signed)
[facility]) Care Management  11/28/2016  Allen Marshall 01-10-1941 [id number]   Referral from MD office:  Telephone call attempts x3 " recording states call cannot be completed; unable to leave message; no alternate numbers noted.  Plan: Will follow up.  Colleen Can, RN BSN CCM Care Management Coordinator [facility] Care Management  [phone]

## 2016-12-06 ENCOUNTER — Ambulatory Visit: Payer: Self-pay | Admitting: *Deleted

## 2016-12-07 ENCOUNTER — Other Ambulatory Visit: Payer: Self-pay | Admitting: *Deleted

## 2016-12-07 ENCOUNTER — Encounter: Payer: Self-pay | Admitting: *Deleted

## 2016-12-07 NOTE — Patient Outreach (Signed)
[facility]) Care Management  12/07/2016  Allen Marshall 10/29/1941 [id number]    Telephone call to patient; recording states number has been changed or disconnected and is no longer in services. Call made to MD office contact -Worthy Flank for alternate number if available. Dewayne Hatch advised [phone] was number listed. States patient needs medication review and environment & safety assessment.   Telephone call made to alternate number; person who answered call advised this RN care coordinator had reached wrong number.   Plan: Will send outreach letter and follow up. Colleen Can, RN BSN CCM Care Management Coordinator [facility] Care Management  [phone]

## 2016-12-12 DIAGNOSIS — M25569 Pain in unspecified knee: Secondary | ICD-10-CM | POA: Diagnosis not present

## 2016-12-12 DIAGNOSIS — M545 Low back pain: Secondary | ICD-10-CM | POA: Diagnosis not present

## 2016-12-12 DIAGNOSIS — I10 Essential (primary) hypertension: Secondary | ICD-10-CM | POA: Diagnosis not present

## 2016-12-12 DIAGNOSIS — I482 Chronic atrial fibrillation: Secondary | ICD-10-CM | POA: Diagnosis not present

## 2016-12-12 DIAGNOSIS — R001 Bradycardia, unspecified: Secondary | ICD-10-CM | POA: Diagnosis not present

## 2016-12-12 DIAGNOSIS — I2782 Chronic pulmonary embolism: Secondary | ICD-10-CM | POA: Diagnosis not present

## 2016-12-25 ENCOUNTER — Encounter: Payer: Self-pay | Admitting: *Deleted

## 2016-12-25 ENCOUNTER — Other Ambulatory Visit: Payer: Self-pay | Admitting: *Deleted

## 2016-12-25 NOTE — Patient Outreach (Signed)
[facility]) Care Management  12/25/2016  AUTHER LYERLY 06/23/1941 [id number]  Unsuccessful call attempts; no response to outreach letter.  Plan: Send MD closure letter. Send to care management assistant to close case.   Colleen Can, RN BSN CCM Care Management Coordinator [facility] Care Management  [phone]

## 2017-01-09 DIAGNOSIS — Z6827 Body mass index (BMI) 27.0-27.9, adult: Secondary | ICD-10-CM | POA: Diagnosis not present

## 2017-01-09 DIAGNOSIS — G8929 Other chronic pain: Secondary | ICD-10-CM | POA: Diagnosis not present

## 2017-01-09 DIAGNOSIS — I482 Chronic atrial fibrillation: Secondary | ICD-10-CM | POA: Diagnosis not present

## 2017-02-08 DIAGNOSIS — K219 Gastro-esophageal reflux disease without esophagitis: Secondary | ICD-10-CM

## 2017-02-08 DIAGNOSIS — I2782 Chronic pulmonary embolism: Secondary | ICD-10-CM

## 2017-02-08 DIAGNOSIS — Z7901 Long term (current) use of anticoagulants: Secondary | ICD-10-CM

## 2017-02-08 DIAGNOSIS — M545 Low back pain, unspecified: Secondary | ICD-10-CM | POA: Insufficient documentation

## 2017-02-08 DIAGNOSIS — R42 Dizziness and giddiness: Secondary | ICD-10-CM

## 2017-02-08 DIAGNOSIS — I482 Chronic atrial fibrillation, unspecified: Secondary | ICD-10-CM

## 2017-02-08 DIAGNOSIS — R972 Elevated prostate specific antigen [PSA]: Secondary | ICD-10-CM

## 2017-02-08 DIAGNOSIS — M130 Polyarthritis, unspecified: Secondary | ICD-10-CM

## 2017-02-08 DIAGNOSIS — R001 Bradycardia, unspecified: Secondary | ICD-10-CM

## 2017-02-08 DIAGNOSIS — M5440 Lumbago with sciatica, unspecified side: Secondary | ICD-10-CM

## 2017-02-08 DIAGNOSIS — R0609 Other forms of dyspnea: Secondary | ICD-10-CM

## 2017-02-08 DIAGNOSIS — I10 Essential (primary) hypertension: Secondary | ICD-10-CM

## 2017-02-08 DIAGNOSIS — R7301 Impaired fasting glucose: Secondary | ICD-10-CM

## 2017-02-11 ENCOUNTER — Encounter (HOSPITAL_COMMUNITY): Payer: Self-pay | Admitting: Emergency Medicine

## 2017-02-11 ENCOUNTER — Ambulatory Visit (INDEPENDENT_AMBULATORY_CARE_PROVIDER_SITE_OTHER): Payer: Medicare HMO | Admitting: Cardiology

## 2017-02-11 ENCOUNTER — Encounter: Payer: Self-pay | Admitting: Cardiology

## 2017-02-11 VITALS — BP 110/76 | HR 80 | Ht 71.0 in | Wt 196.0 lb

## 2017-02-11 DIAGNOSIS — R0602 Shortness of breath: Secondary | ICD-10-CM

## 2017-02-11 DIAGNOSIS — I482 Chronic atrial fibrillation, unspecified: Secondary | ICD-10-CM

## 2017-02-11 DIAGNOSIS — Z86711 Personal history of pulmonary embolism: Secondary | ICD-10-CM

## 2017-02-11 DIAGNOSIS — I10 Essential (primary) hypertension: Secondary | ICD-10-CM

## 2017-02-11 NOTE — Progress Notes (Signed)
Cardiology Office Note  Date: 02/11/2017   ID: Allen Marshall, DOB 11/22/1941, MRN [id number]  PCP: Dwana Melena, MD  Consulting Cardiologist: Nona Dell, MD   Chief Complaint  Patient presents with  . Palpitations  . Shortness of Breath    History of Present Illness: Allen Marshall is a 76 y.o. male referred for cardiology consultation by Dr. Margo Aye. He presents describing a long-standing history of a vague feeling of exertional palpitations and shortness of breath. He states it has been somewhat worse recently, but has been long-standing for years. He has a history of chronic atrial fibrillation and is on both Dilacor XR and Toprol-XL for heart rate control. He is also on Coumadin, but this was actually started previously with history of pulmonary embolus.  He does not report any angina symptoms. Does have chronic leg edema, right worse than left status post vein stripping. 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  I personally reviewed his ECG today which shows rate-controlled atrial fibrillation with IVCD, nonspecific ST-T changes. He has not had a recent echocardiogram. States he had cardiac workup several years ago including a reassuring cardiac catheterization. I am not able to locate the records in Epic.  Past Medical History:  Diagnosis Date  . Arthritis   . Chronic atrial fibrillation (HCC)   . Essential hypertension   . GERD (gastroesophageal reflux disease)   . History of pulmonary embolus (PE)   . Low back pain     Past Surgical History:  Procedure Laterality Date  . VEIN SURGERY      Current Outpatient Prescriptions  Medication Sig Dispense Refill  . diltiazem (DILACOR XR) 180 MG 24 hr capsule Take 180 mg by mouth daily.    . metoprolol succinate (TOPROL-XL) 50 MG 24 hr tablet Take 50 mg by mouth daily. Take with or immediately following a meal.    . traMADol (ULTRAM) 50 MG tablet Take by mouth every 6 (six) hours as needed.    . warfarin (COUMADIN) 5 MG  tablet Take 5 mg by mouth daily.     No current facility-administered medications for this visit.    Allergies:  Patient has no known allergies.   Social History: The patient  reports that he has never smoked. He has never used smokeless tobacco. He reports that he does not drink alcohol or use drugs.   Family History: The patient's family history includes Arthritis in his mother; Asthma in his brother; Diabetes in his sister.   ROS:  Please see the history of present illness. Otherwise, complete review of systems is positive for decreased vision as if a "vail" is in place bilaterally - as not had a recent eye exam. Wears glasses. All other systems are reviewed and negative.   Physical Exam: VS:  BP 110/76 (BP Location: Left Arm)   Pulse 80   Ht 5\' 11"  (1.803 m)   Wt 196 lb (88.9 kg)   SpO2 98%   BMI 27.34 kg/m , BMI Body mass index is 27.34 kg/m.  Wt Readings from Last 3 Encounters:  02/11/17 196 lb (88.9 kg)    General: Elderly male, appears comfortable at rest. HEENT: Conjunctiva and lids normal, oropharynx clear. Neck: Supple, no elevated JVP or carotid bruits, no thyromegaly. Lungs: Clear to auscultation, nonlabored breathing at rest. Cardiac: Irregularly irregular, no S3 or significant systolic murmur, no pericardial rub. Abdomen: Soft, nontender, bowel sounds present, no guarding or rebound. Extremities: Bilateral leg edema right worse than left status post vein stripping,  no cords, distal pulses 1-2+. Skin: Warm and dry. Musculoskeletal: No kyphosis. Neuropsychiatric: Alert and oriented x3, affect grossly appropriate.  ECG: No old tracing available for comparison.  Recent Labwork:  November 2017: Cholesterol 141, HDL 45, triglycerides 122, LDL 76, glucose 83, BUN 16, creatinine 0.9, potassium 4.0, AST 50, ALT 12, hemoglobin A1c 5.3, TSH 3.0, hemoglobin 12.4, platelets 236, INR 3.1  Assessment and Plan:  1. Chronic sense of palpitations and dyspnea on exertion.  Suspect that this is most likely related to chronic atrial fibrillation. Heart rate control looks to be adequate at rest today on Toprol-XL and Dilacor XR. May need to be ultimately up titrated depending on how he does. A cardiac monitor to assess heart rate variability might be considered at that point. For now would recommend an echocardiogram to exclude cardiomyopathy, on otherwise continue with present regimen.  2. History of pulmonary embolus, has been on Coumadin long-term. Also providing stroke prophylaxis with atrial fibrillation and CHADSVASC score of 2.  3. Essential hypertension, blood pressure is well controlled today.  4. History of pulmonary embolus, presumably DVT as well. Has chronic leg edema with history of vein stripping on the right. 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 Do not have complete records.  Current medicines were reviewed with the patient today.   Orders Placed This Encounter  Procedures  . EKG 12-Lead  . ECHOCARDIOGRAM COMPLETE    Disposition: Call with test results.  Signed, Jonelle Sidle, MD, [facility] 02/11/2017 8:43 AM    [facility] at [facility] [address] [place], [place] [address] Phone: [phone]; Fax: [phone]

## 2017-02-11 NOTE — Patient Instructions (Signed)
Your physician recommends that you schedule a follow-up appointment in: to be determined after echo.We will call you with results.  Your physician has requested that you have an echocardiogram. Echocardiography is a painless test that uses sound waves to create images of your heart. It provides your doctor with information about the size and shape of your heart and how well your heart's chambers and valves are working. This procedure takes approximately one hour. There are no restrictions for this procedure.    Your physician recommends that you continue on your current medications as directed. Please refer to the Current Medication list given to you today.     Thank you for choosing [facility] !

## 2017-02-15 ENCOUNTER — Ambulatory Visit (HOSPITAL_COMMUNITY)
Admission: RE | Admit: 2017-02-15 | Discharge: 2017-02-15 | Disposition: A | Discharge status: Home / Self Care | Payer: Medicare HMO | Source: Ambulatory Visit | Attending: Cardiology | Admitting: Cardiology

## 2017-02-15 DIAGNOSIS — I482 Chronic atrial fibrillation, unspecified: Secondary | ICD-10-CM

## 2017-02-15 DIAGNOSIS — Z7901 Long term (current) use of anticoagulants: Secondary | ICD-10-CM | POA: Insufficient documentation

## 2017-02-15 DIAGNOSIS — R0602 Shortness of breath: Secondary | ICD-10-CM | POA: Insufficient documentation

## 2017-02-15 DIAGNOSIS — K219 Gastro-esophageal reflux disease without esophagitis: Secondary | ICD-10-CM | POA: Insufficient documentation

## 2017-02-15 DIAGNOSIS — Z87891 Personal history of nicotine dependence: Secondary | ICD-10-CM | POA: Diagnosis not present

## 2017-02-15 DIAGNOSIS — Z86711 Personal history of pulmonary embolism: Secondary | ICD-10-CM | POA: Insufficient documentation

## 2017-02-15 DIAGNOSIS — I10 Essential (primary) hypertension: Secondary | ICD-10-CM | POA: Insufficient documentation

## 2017-02-15 NOTE — Progress Notes (Signed)
*  PRELIMINARY RESULTS* Echocardiogram 2D Echocardiogram has been performed.  Stacey Drain 02/15/2017, 9:33 AM

## 2017-03-13 DIAGNOSIS — M545 Low back pain: Secondary | ICD-10-CM | POA: Diagnosis not present

## 2017-03-13 DIAGNOSIS — Z7901 Long term (current) use of anticoagulants: Secondary | ICD-10-CM | POA: Diagnosis not present

## 2017-03-13 DIAGNOSIS — H538 Other visual disturbances: Secondary | ICD-10-CM | POA: Diagnosis not present

## 2017-03-13 DIAGNOSIS — R42 Dizziness and giddiness: Secondary | ICD-10-CM | POA: Diagnosis not present

## 2017-03-13 DIAGNOSIS — I482 Chronic atrial fibrillation: Secondary | ICD-10-CM | POA: Diagnosis not present

## 2017-03-13 DIAGNOSIS — M25569 Pain in unspecified knee: Secondary | ICD-10-CM | POA: Diagnosis not present

## 2017-03-18 DIAGNOSIS — D649 Anemia, unspecified: Secondary | ICD-10-CM | POA: Diagnosis not present

## 2017-03-18 DIAGNOSIS — R42 Dizziness and giddiness: Secondary | ICD-10-CM | POA: Diagnosis not present

## 2017-03-18 DIAGNOSIS — R7301 Impaired fasting glucose: Secondary | ICD-10-CM | POA: Diagnosis not present

## 2017-03-18 DIAGNOSIS — M25569 Pain in unspecified knee: Secondary | ICD-10-CM | POA: Diagnosis not present

## 2017-03-18 DIAGNOSIS — Z6826 Body mass index (BMI) 26.0-26.9, adult: Secondary | ICD-10-CM | POA: Diagnosis not present

## 2017-03-18 DIAGNOSIS — R251 Tremor, unspecified: Secondary | ICD-10-CM | POA: Diagnosis not present

## 2017-03-18 DIAGNOSIS — M545 Low back pain: Secondary | ICD-10-CM | POA: Diagnosis not present

## 2017-03-18 DIAGNOSIS — H539 Unspecified visual disturbance: Secondary | ICD-10-CM | POA: Diagnosis not present

## 2017-03-18 DIAGNOSIS — R262 Difficulty in walking, not elsewhere classified: Secondary | ICD-10-CM | POA: Diagnosis not present

## 2017-03-18 DIAGNOSIS — R972 Elevated prostate specific antigen [PSA]: Secondary | ICD-10-CM | POA: Diagnosis not present

## 2017-04-17 DIAGNOSIS — I10 Essential (primary) hypertension: Secondary | ICD-10-CM | POA: Diagnosis not present

## 2017-04-17 DIAGNOSIS — Z6827 Body mass index (BMI) 27.0-27.9, adult: Secondary | ICD-10-CM | POA: Diagnosis not present

## 2017-04-17 DIAGNOSIS — Z7901 Long term (current) use of anticoagulants: Secondary | ICD-10-CM | POA: Diagnosis not present

## 2017-04-17 DIAGNOSIS — I482 Chronic atrial fibrillation: Secondary | ICD-10-CM | POA: Diagnosis not present

## 2017-05-15 DIAGNOSIS — G894 Chronic pain syndrome: Secondary | ICD-10-CM | POA: Diagnosis not present

## 2017-05-15 DIAGNOSIS — I482 Chronic atrial fibrillation: Secondary | ICD-10-CM | POA: Diagnosis not present

## 2017-05-15 DIAGNOSIS — I10 Essential (primary) hypertension: Secondary | ICD-10-CM | POA: Diagnosis not present

## 2017-05-15 DIAGNOSIS — Z6827 Body mass index (BMI) 27.0-27.9, adult: Secondary | ICD-10-CM | POA: Diagnosis not present

## 2017-06-19 DIAGNOSIS — Z7901 Long term (current) use of anticoagulants: Secondary | ICD-10-CM | POA: Diagnosis not present

## 2017-06-19 DIAGNOSIS — Z6827 Body mass index (BMI) 27.0-27.9, adult: Secondary | ICD-10-CM | POA: Diagnosis not present

## 2017-06-19 DIAGNOSIS — I482 Chronic atrial fibrillation: Secondary | ICD-10-CM | POA: Diagnosis not present

## 2017-06-19 DIAGNOSIS — I10 Essential (primary) hypertension: Secondary | ICD-10-CM | POA: Diagnosis not present

## 2017-07-24 DIAGNOSIS — H40013 Open angle with borderline findings, low risk, bilateral: Secondary | ICD-10-CM | POA: Diagnosis not present

## 2017-08-07 DIAGNOSIS — G894 Chronic pain syndrome: Secondary | ICD-10-CM | POA: Diagnosis not present

## 2017-08-07 DIAGNOSIS — I10 Essential (primary) hypertension: Secondary | ICD-10-CM | POA: Diagnosis not present

## 2017-08-07 DIAGNOSIS — I482 Chronic atrial fibrillation: Secondary | ICD-10-CM | POA: Diagnosis not present

## 2017-08-07 DIAGNOSIS — Z7901 Long term (current) use of anticoagulants: Secondary | ICD-10-CM | POA: Diagnosis not present

## 2017-08-21 DIAGNOSIS — H401133 Primary open-angle glaucoma, bilateral, severe stage: Secondary | ICD-10-CM | POA: Diagnosis not present

## 2017-08-21 DIAGNOSIS — H25013 Cortical age-related cataract, bilateral: Secondary | ICD-10-CM | POA: Diagnosis not present

## 2017-08-21 DIAGNOSIS — H25812 Combined forms of age-related cataract, left eye: Secondary | ICD-10-CM | POA: Diagnosis not present

## 2017-08-21 DIAGNOSIS — H25811 Combined forms of age-related cataract, right eye: Secondary | ICD-10-CM | POA: Diagnosis not present

## 2017-08-21 DIAGNOSIS — H25813 Combined forms of age-related cataract, bilateral: Secondary | ICD-10-CM | POA: Diagnosis not present

## 2017-08-29 DIAGNOSIS — H401133 Primary open-angle glaucoma, bilateral, severe stage: Secondary | ICD-10-CM | POA: Diagnosis not present

## 2017-09-05 DIAGNOSIS — Z23 Encounter for immunization: Secondary | ICD-10-CM | POA: Diagnosis not present

## 2017-09-05 DIAGNOSIS — I482 Chronic atrial fibrillation: Secondary | ICD-10-CM | POA: Diagnosis not present

## 2017-09-05 DIAGNOSIS — I10 Essential (primary) hypertension: Secondary | ICD-10-CM | POA: Diagnosis not present

## 2017-09-05 DIAGNOSIS — Z7901 Long term (current) use of anticoagulants: Secondary | ICD-10-CM | POA: Diagnosis not present

## 2017-09-25 DIAGNOSIS — H25811 Combined forms of age-related cataract, right eye: Secondary | ICD-10-CM | POA: Diagnosis not present

## 2017-09-25 DIAGNOSIS — H401113 Primary open-angle glaucoma, right eye, severe stage: Secondary | ICD-10-CM | POA: Diagnosis not present

## 2017-11-11 DIAGNOSIS — I10 Essential (primary) hypertension: Secondary | ICD-10-CM | POA: Diagnosis not present

## 2017-11-11 DIAGNOSIS — Z6833 Body mass index (BMI) 33.0-33.9, adult: Secondary | ICD-10-CM | POA: Diagnosis not present

## 2017-11-11 DIAGNOSIS — I482 Chronic atrial fibrillation: Secondary | ICD-10-CM | POA: Diagnosis not present

## 2017-12-09 DIAGNOSIS — I482 Chronic atrial fibrillation: Secondary | ICD-10-CM | POA: Diagnosis not present

## 2017-12-09 DIAGNOSIS — Z0289 Encounter for other administrative examinations: Secondary | ICD-10-CM | POA: Diagnosis not present

## 2017-12-09 DIAGNOSIS — I10 Essential (primary) hypertension: Secondary | ICD-10-CM | POA: Diagnosis not present

## 2018-01-01 DIAGNOSIS — R42 Dizziness and giddiness: Secondary | ICD-10-CM | POA: Diagnosis not present

## 2018-01-01 DIAGNOSIS — I482 Chronic atrial fibrillation: Secondary | ICD-10-CM | POA: Diagnosis not present

## 2018-01-22 DIAGNOSIS — Z6832 Body mass index (BMI) 32.0-32.9, adult: Secondary | ICD-10-CM | POA: Diagnosis not present

## 2018-01-22 DIAGNOSIS — I482 Chronic atrial fibrillation: Secondary | ICD-10-CM | POA: Diagnosis not present

## 2018-01-22 DIAGNOSIS — Z7901 Long term (current) use of anticoagulants: Secondary | ICD-10-CM | POA: Diagnosis not present

## 2018-01-22 DIAGNOSIS — I10 Essential (primary) hypertension: Secondary | ICD-10-CM | POA: Diagnosis not present

## 2018-01-22 DIAGNOSIS — M545 Low back pain: Secondary | ICD-10-CM | POA: Diagnosis not present

## 2018-01-22 DIAGNOSIS — M130 Polyarthritis, unspecified: Secondary | ICD-10-CM | POA: Diagnosis not present

## 2018-01-22 DIAGNOSIS — I2782 Chronic pulmonary embolism: Secondary | ICD-10-CM | POA: Diagnosis not present

## 2018-01-22 DIAGNOSIS — K219 Gastro-esophageal reflux disease without esophagitis: Secondary | ICD-10-CM | POA: Diagnosis not present

## 2018-01-22 DIAGNOSIS — G894 Chronic pain syndrome: Secondary | ICD-10-CM | POA: Diagnosis not present

## 2018-01-22 DIAGNOSIS — R972 Elevated prostate specific antigen [PSA]: Secondary | ICD-10-CM | POA: Diagnosis not present

## 2018-01-30 DIAGNOSIS — H401133 Primary open-angle glaucoma, bilateral, severe stage: Secondary | ICD-10-CM | POA: Diagnosis not present

## 2018-01-30 DIAGNOSIS — H25812 Combined forms of age-related cataract, left eye: Secondary | ICD-10-CM | POA: Diagnosis not present

## 2018-02-13 DIAGNOSIS — H401133 Primary open-angle glaucoma, bilateral, severe stage: Secondary | ICD-10-CM | POA: Diagnosis not present

## 2018-02-13 DIAGNOSIS — H25812 Combined forms of age-related cataract, left eye: Secondary | ICD-10-CM | POA: Diagnosis not present

## 2018-02-13 DIAGNOSIS — Z961 Presence of intraocular lens: Secondary | ICD-10-CM | POA: Diagnosis not present

## 2018-02-19 DIAGNOSIS — Z7901 Long term (current) use of anticoagulants: Secondary | ICD-10-CM | POA: Diagnosis not present

## 2018-02-19 DIAGNOSIS — I2782 Chronic pulmonary embolism: Secondary | ICD-10-CM | POA: Diagnosis not present

## 2018-02-19 DIAGNOSIS — M545 Low back pain: Secondary | ICD-10-CM | POA: Diagnosis not present

## 2018-02-19 DIAGNOSIS — I482 Chronic atrial fibrillation: Secondary | ICD-10-CM | POA: Diagnosis not present

## 2018-02-19 DIAGNOSIS — G894 Chronic pain syndrome: Secondary | ICD-10-CM | POA: Diagnosis not present

## 2018-02-19 DIAGNOSIS — I10 Essential (primary) hypertension: Secondary | ICD-10-CM | POA: Diagnosis not present

## 2018-02-19 DIAGNOSIS — M130 Polyarthritis, unspecified: Secondary | ICD-10-CM | POA: Diagnosis not present

## 2018-02-19 DIAGNOSIS — K219 Gastro-esophageal reflux disease without esophagitis: Secondary | ICD-10-CM | POA: Diagnosis not present

## 2018-02-19 DIAGNOSIS — R972 Elevated prostate specific antigen [PSA]: Secondary | ICD-10-CM | POA: Diagnosis not present

## 2018-03-19 DIAGNOSIS — I2782 Chronic pulmonary embolism: Secondary | ICD-10-CM | POA: Diagnosis not present

## 2018-03-19 DIAGNOSIS — K219 Gastro-esophageal reflux disease without esophagitis: Secondary | ICD-10-CM | POA: Diagnosis not present

## 2018-03-19 DIAGNOSIS — Z7901 Long term (current) use of anticoagulants: Secondary | ICD-10-CM | POA: Diagnosis not present

## 2018-03-19 DIAGNOSIS — G894 Chronic pain syndrome: Secondary | ICD-10-CM | POA: Diagnosis not present

## 2018-03-19 DIAGNOSIS — I482 Chronic atrial fibrillation: Secondary | ICD-10-CM | POA: Diagnosis not present

## 2018-04-14 ENCOUNTER — Other Ambulatory Visit: Payer: Self-pay

## 2018-04-14 ENCOUNTER — Emergency Department (HOSPITAL_COMMUNITY): Payer: Medicare HMO

## 2018-04-14 ENCOUNTER — Encounter (HOSPITAL_COMMUNITY): Payer: Self-pay | Admitting: *Deleted

## 2018-04-14 ENCOUNTER — Emergency Department (HOSPITAL_COMMUNITY)
Admission: EM | Admit: 2018-04-14 | Discharge: 2018-04-14 | Disposition: A | Discharge status: Home / Self Care | Payer: Medicare HMO | Attending: Emergency Medicine | Admitting: Emergency Medicine

## 2018-04-14 DIAGNOSIS — Z7901 Long term (current) use of anticoagulants: Secondary | ICD-10-CM | POA: Insufficient documentation

## 2018-04-14 DIAGNOSIS — R0609 Other forms of dyspnea: Secondary | ICD-10-CM | POA: Diagnosis not present

## 2018-04-14 DIAGNOSIS — I129 Hypertensive chronic kidney disease with stage 1 through stage 4 chronic kidney disease, or unspecified chronic kidney disease: Secondary | ICD-10-CM | POA: Diagnosis not present

## 2018-04-14 DIAGNOSIS — R55 Syncope and collapse: Secondary | ICD-10-CM | POA: Diagnosis not present

## 2018-04-14 DIAGNOSIS — N189 Chronic kidney disease, unspecified: Secondary | ICD-10-CM | POA: Diagnosis not present

## 2018-04-14 DIAGNOSIS — R270 Ataxia, unspecified: Secondary | ICD-10-CM | POA: Diagnosis not present

## 2018-04-14 DIAGNOSIS — Z79899 Other long term (current) drug therapy: Secondary | ICD-10-CM | POA: Insufficient documentation

## 2018-04-14 DIAGNOSIS — R0602 Shortness of breath: Secondary | ICD-10-CM | POA: Diagnosis not present

## 2018-04-14 DIAGNOSIS — R42 Dizziness and giddiness: Secondary | ICD-10-CM

## 2018-04-14 LAB — PROTIME-INR
INR: 1.91
Prothrombin Time: 21.7 seconds — ABNORMAL HIGH (ref 11.4–15.2)

## 2018-04-14 LAB — CBC WITH DIFFERENTIAL/PLATELET
Basophils Absolute: 0 10*3/uL (ref 0.0–0.1)
Basophils Relative: 1 %
Eosinophils Absolute: 0.1 10*3/uL (ref 0.0–0.7)
Eosinophils Relative: 3 %
HCT: 37.8 % — ABNORMAL LOW (ref 39.0–52.0)
Hemoglobin: 11.6 g/dL — ABNORMAL LOW (ref 13.0–17.0)
Lymphocytes Relative: 45 %
Lymphs Abs: 2 10*3/uL (ref 0.7–4.0)
MCH: 29.8 pg (ref 26.0–34.0)
MCHC: 30.7 g/dL (ref 30.0–36.0)
MCV: 97.2 fL (ref 78.0–100.0)
Monocytes Absolute: 0.4 10*3/uL (ref 0.1–1.0)
Monocytes Relative: 10 %
Neutro Abs: 1.7 10*3/uL (ref 1.7–7.7)
Neutrophils Relative %: 41 %
Platelets: 174 10*3/uL (ref 150–400)
RBC: 3.89 MIL/uL — ABNORMAL LOW (ref 4.22–5.81)
RDW: 13.1 % (ref 11.5–15.5)
WBC: 4.3 10*3/uL (ref 4.0–10.5)

## 2018-04-14 LAB — URINALYSIS, ROUTINE W REFLEX MICROSCOPIC
Bacteria, UA: NONE SEEN
Bilirubin Urine: NEGATIVE
Glucose, UA: NEGATIVE mg/dL
Ketones, ur: NEGATIVE mg/dL
Leukocytes, UA: NEGATIVE
Nitrite: NEGATIVE
Protein, ur: NEGATIVE mg/dL
Specific Gravity, Urine: 1.009 (ref 1.005–1.030)
pH: 6 (ref 5.0–8.0)

## 2018-04-14 LAB — BASIC METABOLIC PANEL
Anion gap: 5 (ref 5–15)
BUN: 27 mg/dL — ABNORMAL HIGH (ref 6–20)
CO2: 29 mmol/L (ref 22–32)
Calcium: 8.7 mg/dL — ABNORMAL LOW (ref 8.9–10.3)
Chloride: 104 mmol/L (ref 101–111)
Creatinine, Ser: 1.25 mg/dL — ABNORMAL HIGH (ref 0.61–1.24)
GFR calc Af Amer: >60 mL/min (ref >60)
GFR calc non Af Amer: 54 mL/min — ABNORMAL LOW (ref >60)
Glucose, Bld: 90 mg/dL (ref 65–99)
Potassium: 4.3 mmol/L (ref 3.5–5.1)
Sodium: 138 mmol/L (ref 135–145)

## 2018-04-14 LAB — D-DIMER, QUANTITATIVE (NOT AT ARMC): D DIMER QUANT: 6.81 ug{FEU}/mL — AB (ref 0.00–0.50)

## 2018-04-14 LAB — BRAIN NATRIURETIC PEPTIDE: B Natriuretic Peptide: 94 pg/mL (ref 0.0–100.0)

## 2018-04-14 LAB — I-STAT TROPONIN, ED: Troponin i, poc: 0 ng/mL (ref 0.00–0.08)

## 2018-04-14 MED ORDER — LORAZEPAM 0.5 MG PO TABS: 0.5000 mg | ORAL_TABLET | Freq: Two times a day (BID) | ORAL | 0 refills | Status: AC | PRN

## 2018-04-14 MED ORDER — MECLIZINE HCL 12.5 MG PO TABS
12.5000 mg | ORAL_TABLET | Freq: Once | ORAL | Status: AC
  Administered 2018-04-14: 12.5 mg via ORAL
  Filled 2018-04-14: qty 1

## 2018-04-14 MED ORDER — IOPAMIDOL (ISOVUE-370) INJECTION 76%
100.0000 mL | Freq: Once | INTRAVENOUS | Status: AC | PRN
  Administered 2018-04-14: 100 mL via INTRAVENOUS

## 2018-04-14 MED ORDER — MECLIZINE HCL 12.5 MG PO TABS: 12.5000 mg | ORAL_TABLET | Freq: Two times a day (BID) | ORAL | 0 refills | Status: AC | PRN

## 2018-04-14 MED ORDER — LORAZEPAM 0.5 MG PO TABS
0.5000 mg | ORAL_TABLET | Freq: Once | ORAL | Status: AC
  Administered 2018-04-14: 0.5 mg via ORAL
  Filled 2018-04-14: qty 1

## 2018-04-14 NOTE — ED Triage Notes (Signed)
Pt states he has been feeling dizzy; pt denies any n/v or pain; pt c/o sob; pt states he got up to go to the bathroom and almost fell

## 2018-04-14 NOTE — ED Notes (Signed)
PT ambulated to bathroom with stand by assist and denied any dizziness with ambulation.

## 2018-04-14 NOTE — Discharge Instructions (Addendum)
CT scan of chest did not show any blood clot in your lungs.  Prescription for medication for dizziness.  Follow-up with your primary care doctor.

## 2018-04-14 NOTE — ED Provider Notes (Signed)
Patient was rechecked in approximately 11 AM.  No gross neurological deficits were noted.  CT angiogram of the chest showed no pulmonary embolism.  Will Rx meclizine 12.5 mg and Ativan 0.5 mg.  This seemed to help the patient.  He was ambulatory before discharge without ataxia.  Discharge medications meclizine 12.5 mg and Ativan 0.5 mg   Donnetta Hutching, MD 04/14/18 1256

## 2018-04-14 NOTE — ED Provider Notes (Signed)
[facility] EMERGENCY DEPARTMENT Provider Note   CSN: [id number] Arrival date & time: 04/14/18  0522     History   Chief Complaint Chief Complaint  Patient presents with  . Dizziness    HPI Allen Marshall is a 77 y.o. male.  Patient presents to the emergency department for dizziness and shortness of breath.  He reports that symptoms have been ongoing for quite some time.  The dizziness has been present for at least 5 months.  He has been short of breath for some time as well.  He reports that he has noticed that he has been getting short of breath with less and less exercise recently.  He now cannot go to the mailbox without having to rest several times.  He does not experience any chest pain.  He has not had cough.  Patient reports that he got up from bed tonight to go to the bathroom and felt like he was going to pass out.  Dizziness was much worse than it has been.     Past Medical History:  Diagnosis Date  . Allergic reaction 06/17/2012   Oral edema; possibly food related  . Anemia    05/2011: H&H-10.2/31.3, MCV-92  . Arthritis   . Atrial fibrillation (HCC) <2011   Arrhythmia of uncertain duration, but it least one year; additional records pending as of 03/2013  . Benign prostatic hypertrophy   . Chronic anticoagulation   . Chronic atrial fibrillation (HCC)   . Chronic kidney disease    05/2011: BUN/Cr- 15/1.24, GFR-68  . Degenerative joint disease of knee    Bilateral; also hips  . Erectile dysfunction   . Essential hypertension   . Gastroesophageal reflux    Frequent pyrosis  . GERD (gastroesophageal reflux disease)   . History of pulmonary embolus (PE)   . Hypertension   . Insomnia    Difficulty falling asleep  . Leg edema, right Chronic   Status post venectomy 1990s  . Low back pain   . Pulmonary embolism (HCC) 2003, 2014   Recurrent pulmonary embolism with subtherapeutic INR in 12/2012  . Tobacco abuse, in remission    40 pack years; discontinued 2006;  office spirometry in 2012: Nl ex FEF 25-75%    Patient Active Problem List   Diagnosis Date Noted  . GERD (gastroesophageal reflux disease) 02/08/2017  . Chronic pulmonary embolism (HCC) 02/08/2017  . 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 Chronic atrial fibrillation (HCC) 02/08/2017  . Elevated PSA 02/08/2017  . Low back pain 02/08/2017  . Hypertension 02/08/2017  . Long-term (current) use of anticoagulants 02/08/2017  . Bradycardia 02/08/2017  . Dizziness 02/08/2017  . DOE (dyspnea on exertion) 02/08/2017  . Impaired fasting glucose 02/08/2017  . Tobacco abuse, in remission   . Cardiomyopathy-ruled out 04/12/2013  . Pulmonary embolism (HCC)   . Hypertension   . Degenerative joint disease of knee   . Atrial fibrillation (HCC)   . Gastroesophageal reflux   . Chronic anticoagulation 12/26/2012    Past Surgical History:  Procedure Laterality Date  . CATARACT EXTRACTION W/ INTRAOCULAR LENS IMPLANT Bilateral   . COLONOSCOPY  2011   Negative screening study  . VARICOSE VEIN SURGERY  1970s  . VEIN SURGERY          Home Medications    Prior to Admission medications   Medication Sig Start Date End Date Taking? Authorizing Provider  metoprolol (LOPRESSOR) 50 MG tablet TAKE ONE AND ONE HALF TABLET BY MOUTH TWICE DAILY. 03/15/15  Yes Lyman Bishop,  Bettey Mare, NP  traMADol (ULTRAM) 50 MG tablet Take by mouth every 6 (six) hours as needed.   Yes [provider]  warfarin (COUMADIN) 1 MG tablet Take 1 tablet (1 mg total) by mouth daily. 12/30/12  Yes Christiane Ha, MD  warfarin (COUMADIN) 5 MG tablet Take 1 tablet (5 mg total) by mouth daily. 12/30/12  Yes Christiane Ha, MD  warfarin (COUMADIN) 5 MG tablet Take 5 mg by mouth daily.   Yes [provider]  cephALEXin (KEFLEX) 500 MG capsule Take 1 capsule (500 mg total) by mouth 4 (four) times daily. 10/13/16   Elson Areas, PA-C  diltiazem (DILACOR XR) 180 MG 24 hr capsule Take 1 capsule (180 mg total) by mouth daily.  09/13/14   Jodelle Gross, NP  diltiazem (DILACOR XR) 180 MG 24 hr capsule Take 180 mg by mouth daily.    [provider]  furosemide (LASIX) 20 MG tablet Take 1 tablet (20 mg total) by mouth daily as needed. 06/03/13   Kathlen Brunswick, MD  metoprolol succinate (TOPROL-XL) 50 MG 24 hr tablet Take 50 mg by mouth daily. Take with or immediately following a meal.    [provider]  omeprazole (PRILOSEC) 20 MG capsule Take 20 mg by mouth daily.    [provider]    Family History Family History  Problem Relation Age of Onset  . Arthritis Mother   . Diabetes Sister   . Asthma Brother   . Atrial fibrillation Mother        Possible history of arrhythmia-patient recalls only palpitations  . Alzheimer's disease Father     Social History Social History   Tobacco Use  . Smoking status: Never Smoker  . Smokeless tobacco: Never Used  Substance Use Topics  . Alcohol use: No    Comment: quit 8 yrs ago  . Drug use: No     Allergies   Patient has no known allergies.   Review of Systems Review of Systems  Respiratory: Positive for shortness of breath.   Neurological: Positive for dizziness.  All other systems reviewed and are negative.    Physical Exam Updated Vital Signs BP (!) 152/85 (BP Location: Left Arm)   Pulse 66   Temp 97.8 F (36.6 C) (Oral)   Resp 18   Ht 5\' 11"  (1.803 m)   Wt 89.8 kg (198 lb)   SpO2 100%   BMI 27.62 kg/m   Physical Exam  Constitutional: He is oriented to person, place, and time. He appears well-developed and well-nourished. No distress.  HENT:  Head: Normocephalic and atraumatic.  Right Ear: Hearing normal.  Left Ear: Hearing normal.  Nose: Nose normal.  Mouth/Throat: Oropharynx is clear and moist and mucous membranes are normal.  Eyes: Pupils are equal, round, and reactive to light. Conjunctivae and EOM are normal.  Neck: Normal range of motion. Neck supple.  Cardiovascular: Regular rhythm, S1 normal and  S2 normal. Exam reveals no gallop and no friction rub.  No murmur heard. Pulmonary/Chest: Effort normal and breath sounds normal. No respiratory distress. He exhibits no tenderness.  Abdominal: Soft. Normal appearance and bowel sounds are normal. There is no hepatosplenomegaly. There is no tenderness. There is no rebound, no guarding, no tenderness at McBurney's point and negative Murphy's sign. No hernia.  Musculoskeletal: Normal range of motion.  Neurological: He is alert and oriented to person, place, and time. He has normal strength. No cranial nerve deficit or sensory deficit. Coordination normal.  GCS eye subscore is 4. GCS verbal subscore is 5. GCS motor subscore is 6.  Grip strength equal bilaterally.  Lower extremity strength equal bilaterally.  Finger-to-nose normal bilaterally.  Skin: Skin is warm, dry and intact. No rash noted. No cyanosis.  Psychiatric: He has a normal mood and affect. His speech is normal and behavior is normal. Thought content normal.  Nursing note and vitals reviewed.    ED Treatments / Results  Labs (all labs ordered are listed, but only abnormal results are displayed) Labs Reviewed  CBC WITH DIFFERENTIAL/PLATELET - Abnormal; Notable for the following components:      Result Value   RBC 3.89 (*)    Hemoglobin 11.6 (*)    HCT 37.8 (*)    All other components within normal limits  BASIC METABOLIC PANEL - Abnormal; Notable for the following components:   BUN 27 (*)    Creatinine, Ser 1.25 (*)    Calcium 8.7 (*)    GFR calc non Af Amer 54 (*)    All other components within normal limits  URINALYSIS, ROUTINE W REFLEX MICROSCOPIC - Abnormal; Notable for the following components:   Hgb urine dipstick MODERATE (*)    All other components within normal limits  D-DIMER, QUANTITATIVE (NOT AT [facility]) - Abnormal; Notable for the following components:   D-Dimer, Quant 6.81 (*)    All other components within normal limits  PROTIME-INR - Abnormal; Notable for the  following components:   Prothrombin Time 21.7 (*)    All other components within normal limits  BRAIN NATRIURETIC PEPTIDE  I-STAT TROPONIN, ED    EKG EKG Interpretation  Date/Time:  Monday Apr 14 2018 05:32:20 EDT Ventricular Rate:  62 PR Interval:    QRS Duration: 118 QT Interval:  440 QTC Calculation: 447 R Axis:   60 Text Interpretation:  Sinus rhythm Multiple premature complexes, vent & supraven Incomplete left bundle branch block Borderline low voltage, extremity leads Confirmed by Gilda Crease [phone]) on 04/14/2018 6:06:54 AM   Radiology Dg Chest 2 View  Result Date: 04/14/2018 CLINICAL DATA:  77 y/o  M; shortness of breath. EXAM: CHEST - 2 VIEW COMPARISON:  12/26/2012 chest radiograph. FINDINGS: Normal cardiac silhouette. Aortic atherosclerosis with calcification. No focal consolidation. No pleural effusion or pneumothorax. Mild S-shaped curvature of the spine. No acute osseous abnormality is evident. IMPRESSION: No acute pulmonary process identified. Electronically Signed   By: Mitzi Hansen M.D.   On: 04/14/2018 06:31   Ct Head Wo Contrast  Result Date: 04/14/2018 CLINICAL DATA:  77 y/o  M; ataxia, stroke suspected. EXAM: CT HEAD WITHOUT CONTRAST TECHNIQUE: Contiguous axial images were obtained from the base of the skull through the vertex without intravenous contrast. COMPARISON:  None. FINDINGS: Brain: No evidence of acute infarction, hemorrhage, hydrocephalus, extra-axial collection or mass lesion/mass effect. Dystrophic calcifications within globus palatine. Mild chronic microvascular ischemic changes and parenchymal volume loss of the brain. Vascular: Calcific atherosclerosis of carotid siphons. No hyperdense vessel. Skull: Normal. Negative for fracture or focal lesion. Sinuses/Orbits: Chronic left lamina papyracea fracture. Normal aeration of paranasal sinuses and mastoid air cells. Right intra-ocular lens replacement. Other: None. IMPRESSION: 1. No acute  intracranial abnormality identified. 2. Mild chronic microvascular ischemic changes and parenchymal volume loss of the brain for age. Electronically Signed   By: Mitzi Hansen M.D.   On: 04/14/2018 06:27    Procedures Procedures (including critical care time)  Medications Ordered in ED Medications - No data to display   Initial Impression /  Assessment and Plan / ED Course  I have reviewed the triage vital signs and the nursing notes.  Pertinent labs & imaging results that were available during my care of the patient were reviewed by me and considered in my medical decision making (see chart for details).     Patient presents to emergency department for multiple complaints.  Patient reports that he has had increased dizziness today.  This dizziness has been somewhat chronic for him, but tonight he felt disoriented and extremely off balance.  He also reports that he is short of breath.  This has been ongoing for some time as well.  It seems as though he has had progressive worsening of dyspnea on exertion.  He has not experiencing any chest pain.  Troponin is negative.  EKG does not suggest ischemia or infarct.  CT head does not show obvious acute stroke.  Chest x-ray is clear, no evidence of pathology.  His BNP is normal, does not appear to be in heart failure.  Patient does have a history of PE.  He is on Coumadin but is slightly subtherapeutic.  D-dimer is markedly elevated.  Will undergo CT angiography chest. Will sign out to oncoming ER physician to follow up CT and disposition.  Final Clinical Impressions(s) / ED Diagnoses   Final diagnoses:  Dizziness  Near syncope  DOE (dyspnea on exertion)    ED Discharge Orders    None       Pollina, Canary Brim, MD 04/14/18 [phone]

## 2018-04-23 DIAGNOSIS — I2782 Chronic pulmonary embolism: Secondary | ICD-10-CM | POA: Diagnosis not present

## 2018-04-23 DIAGNOSIS — M545 Low back pain: Secondary | ICD-10-CM | POA: Diagnosis not present

## 2018-04-23 DIAGNOSIS — M130 Polyarthritis, unspecified: Secondary | ICD-10-CM | POA: Diagnosis not present

## 2018-04-23 DIAGNOSIS — Z7901 Long term (current) use of anticoagulants: Secondary | ICD-10-CM | POA: Diagnosis not present

## 2018-04-23 DIAGNOSIS — G894 Chronic pain syndrome: Secondary | ICD-10-CM | POA: Diagnosis not present

## 2018-04-23 DIAGNOSIS — K219 Gastro-esophageal reflux disease without esophagitis: Secondary | ICD-10-CM | POA: Diagnosis not present

## 2018-04-23 DIAGNOSIS — R972 Elevated prostate specific antigen [PSA]: Secondary | ICD-10-CM | POA: Diagnosis not present

## 2018-04-23 DIAGNOSIS — I482 Chronic atrial fibrillation: Secondary | ICD-10-CM | POA: Diagnosis not present

## 2018-04-23 DIAGNOSIS — I10 Essential (primary) hypertension: Secondary | ICD-10-CM | POA: Diagnosis not present

## 2018-05-22 DIAGNOSIS — F4010 Social phobia, unspecified: Secondary | ICD-10-CM | POA: Diagnosis not present

## 2018-05-22 DIAGNOSIS — R42 Dizziness and giddiness: Secondary | ICD-10-CM | POA: Diagnosis not present

## 2018-05-22 DIAGNOSIS — R600 Localized edema: Secondary | ICD-10-CM | POA: Diagnosis not present

## 2018-05-22 DIAGNOSIS — R972 Elevated prostate specific antigen [PSA]: Secondary | ICD-10-CM | POA: Diagnosis not present

## 2018-05-22 DIAGNOSIS — I482 Chronic atrial fibrillation: Secondary | ICD-10-CM | POA: Diagnosis not present

## 2018-05-29 DIAGNOSIS — Z7901 Long term (current) use of anticoagulants: Secondary | ICD-10-CM | POA: Diagnosis not present

## 2018-05-29 DIAGNOSIS — R42 Dizziness and giddiness: Secondary | ICD-10-CM | POA: Diagnosis not present

## 2018-05-29 DIAGNOSIS — F419 Anxiety disorder, unspecified: Secondary | ICD-10-CM | POA: Diagnosis not present

## 2018-05-29 DIAGNOSIS — R600 Localized edema: Secondary | ICD-10-CM | POA: Diagnosis not present

## 2018-05-29 DIAGNOSIS — R7301 Impaired fasting glucose: Secondary | ICD-10-CM | POA: Diagnosis not present

## 2018-05-29 DIAGNOSIS — R0602 Shortness of breath: Secondary | ICD-10-CM | POA: Diagnosis not present

## 2018-05-29 DIAGNOSIS — I482 Chronic atrial fibrillation: Secondary | ICD-10-CM | POA: Diagnosis not present

## 2018-05-29 DIAGNOSIS — I2782 Chronic pulmonary embolism: Secondary | ICD-10-CM | POA: Diagnosis not present

## 2018-05-29 DIAGNOSIS — M130 Polyarthritis, unspecified: Secondary | ICD-10-CM | POA: Diagnosis not present

## 2018-05-29 DIAGNOSIS — R972 Elevated prostate specific antigen [PSA]: Secondary | ICD-10-CM | POA: Diagnosis not present

## 2018-05-29 DIAGNOSIS — Z6833 Body mass index (BMI) 33.0-33.9, adult: Secondary | ICD-10-CM | POA: Diagnosis not present

## 2018-07-03 DIAGNOSIS — I2782 Chronic pulmonary embolism: Secondary | ICD-10-CM | POA: Diagnosis not present

## 2018-07-03 DIAGNOSIS — I482 Chronic atrial fibrillation: Secondary | ICD-10-CM | POA: Diagnosis not present

## 2018-07-03 DIAGNOSIS — Z7901 Long term (current) use of anticoagulants: Secondary | ICD-10-CM | POA: Diagnosis not present

## 2018-07-03 DIAGNOSIS — Z6833 Body mass index (BMI) 33.0-33.9, adult: Secondary | ICD-10-CM | POA: Diagnosis not present

## 2018-07-21 DIAGNOSIS — I10 Essential (primary) hypertension: Secondary | ICD-10-CM | POA: Diagnosis not present

## 2018-07-21 DIAGNOSIS — I482 Chronic atrial fibrillation: Secondary | ICD-10-CM | POA: Diagnosis not present

## 2018-07-21 DIAGNOSIS — K219 Gastro-esophageal reflux disease without esophagitis: Secondary | ICD-10-CM | POA: Diagnosis not present

## 2018-07-21 DIAGNOSIS — I2782 Chronic pulmonary embolism: Secondary | ICD-10-CM | POA: Diagnosis not present

## 2018-07-21 DIAGNOSIS — R600 Localized edema: Secondary | ICD-10-CM | POA: Diagnosis not present

## 2018-07-21 DIAGNOSIS — G894 Chronic pain syndrome: Secondary | ICD-10-CM | POA: Diagnosis not present

## 2018-07-21 DIAGNOSIS — R7301 Impaired fasting glucose: Secondary | ICD-10-CM | POA: Diagnosis not present

## 2018-07-21 DIAGNOSIS — F419 Anxiety disorder, unspecified: Secondary | ICD-10-CM | POA: Diagnosis not present

## 2018-07-29 ENCOUNTER — Emergency Department (HOSPITAL_COMMUNITY)
Admission: EM | Admit: 2018-07-29 | Discharge: 2018-07-29 | Disposition: A | Discharge status: Home / Self Care | Payer: Medicare HMO | Attending: Emergency Medicine | Admitting: Emergency Medicine

## 2018-07-29 ENCOUNTER — Emergency Department (HOSPITAL_COMMUNITY): Payer: Medicare HMO

## 2018-07-29 ENCOUNTER — Other Ambulatory Visit: Payer: Self-pay

## 2018-07-29 ENCOUNTER — Encounter (HOSPITAL_COMMUNITY): Payer: Self-pay | Admitting: Emergency Medicine

## 2018-07-29 DIAGNOSIS — Z7901 Long term (current) use of anticoagulants: Secondary | ICD-10-CM | POA: Insufficient documentation

## 2018-07-29 DIAGNOSIS — J90 Pleural effusion, not elsewhere classified: Secondary | ICD-10-CM | POA: Insufficient documentation

## 2018-07-29 DIAGNOSIS — Z87891 Personal history of nicotine dependence: Secondary | ICD-10-CM | POA: Insufficient documentation

## 2018-07-29 DIAGNOSIS — N189 Chronic kidney disease, unspecified: Secondary | ICD-10-CM | POA: Insufficient documentation

## 2018-07-29 DIAGNOSIS — J918 Pleural effusion in other conditions classified elsewhere: Secondary | ICD-10-CM | POA: Diagnosis not present

## 2018-07-29 DIAGNOSIS — I129 Hypertensive chronic kidney disease with stage 1 through stage 4 chronic kidney disease, or unspecified chronic kidney disease: Secondary | ICD-10-CM | POA: Diagnosis not present

## 2018-07-29 DIAGNOSIS — Z86711 Personal history of pulmonary embolism: Secondary | ICD-10-CM | POA: Diagnosis not present

## 2018-07-29 DIAGNOSIS — R0602 Shortness of breath: Secondary | ICD-10-CM | POA: Diagnosis not present

## 2018-07-29 DIAGNOSIS — Z79899 Other long term (current) drug therapy: Secondary | ICD-10-CM | POA: Insufficient documentation

## 2018-07-29 LAB — CBC WITH DIFFERENTIAL/PLATELET
BASOS PCT: 1 %
Basophils Absolute: 0 10*3/uL (ref 0.0–0.1)
Eosinophils Absolute: 0.2 10*3/uL (ref 0.0–0.7)
Eosinophils Relative: 4 %
HEMATOCRIT: 36.7 % — AB (ref 39.0–52.0)
HEMOGLOBIN: 11.1 g/dL — AB (ref 13.0–17.0)
Lymphocytes Relative: 21 %
Lymphs Abs: 1.1 10*3/uL (ref 0.7–4.0)
MCH: 29.1 pg (ref 26.0–34.0)
MCHC: 30.2 g/dL (ref 30.0–36.0)
MCV: 96.1 fL (ref 78.0–100.0)
MONOS PCT: 11 %
Monocytes Absolute: 0.6 10*3/uL (ref 0.1–1.0)
NEUTROS ABS: 3.3 10*3/uL (ref 1.7–7.7)
Neutrophils Relative %: 63 %
Platelets: 222 10*3/uL (ref 150–400)
RBC: 3.82 MIL/uL — ABNORMAL LOW (ref 4.22–5.81)
RDW: 13.6 % (ref 11.5–15.5)
WBC: 5.2 10*3/uL (ref 4.0–10.5)

## 2018-07-29 LAB — BASIC METABOLIC PANEL
ANION GAP: 6 (ref 5–15)
BUN: 20 mg/dL (ref 8–23)
CHLORIDE: 108 mmol/L (ref 98–111)
CO2: 26 mmol/L (ref 22–32)
CREATININE: 1.28 mg/dL — AB (ref 0.61–1.24)
Calcium: 8 mg/dL — ABNORMAL LOW (ref 8.9–10.3)
GFR calc non Af Amer: 52 mL/min — ABNORMAL LOW (ref >60)
Glucose, Bld: 104 mg/dL — ABNORMAL HIGH (ref 70–99)
POTASSIUM: 4.4 mmol/L (ref 3.5–5.1)
Sodium: 140 mmol/L (ref 135–145)

## 2018-07-29 LAB — PROTIME-INR
INR: 3.07
PROTHROMBIN TIME: 31.5 s — AB (ref 11.4–15.2)

## 2018-07-29 LAB — TROPONIN I: Troponin I: <0.03 ng/mL (ref <0.03)

## 2018-07-29 NOTE — ED Provider Notes (Signed)
[facility] EMERGENCY DEPARTMENT Provider Note   CSN: [id number] Arrival date & time: 07/29/18  1847     History   Chief Complaint Chief Complaint  Patient presents with  . Shortness of Breath    HPI Allen Marshall is a 77 y.o. male.  Patient reports worsening dyspnea for the past month without chest pain.  No fever, sweats, chills, weight loss.  Past medical history includes atrial fibrillation, remote history of pulmonary embolism, hypertension, chronic anticoagulation, several others.  Severity of symptoms is moderate.  Exertion makes symptoms worse.     Past Medical History:  Diagnosis Date  . Allergic reaction 06/17/2012   Oral edema; possibly food related  . Anemia    05/2011: H&H-10.2/31.3, MCV-92  . Arthritis   . Atrial fibrillation (HCC) <2011   Arrhythmia of uncertain duration, but it least one year; additional records pending as of 03/2013  . Benign prostatic hypertrophy   . Chronic anticoagulation   . Chronic atrial fibrillation (HCC)   . Chronic kidney disease    05/2011: BUN/Cr- 15/1.24, GFR-68  . Degenerative joint disease of knee    Bilateral; also hips  . Erectile dysfunction   . Essential hypertension   . Gastroesophageal reflux    Frequent pyrosis  . GERD (gastroesophageal reflux disease)   . History of pulmonary embolus (PE)   . Hypertension   . Insomnia    Difficulty falling asleep  . Leg edema, right Chronic   Status post venectomy 1990s  . Low back pain   . Pulmonary embolism (HCC) 2003, 2014   Recurrent pulmonary embolism with subtherapeutic INR in 12/2012  . Tobacco abuse, in remission    40 pack years; discontinued 2006; office spirometry in 2012: Nl ex FEF 25-75%    Patient Active Problem List   Diagnosis Date Noted  . GERD (gastroesophageal reflux disease) 02/08/2017  . Chronic pulmonary embolism (HCC) 02/08/2017  . 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 Chronic atrial fibrillation (HCC) 02/08/2017  . Elevated PSA 02/08/2017  . Low back  pain 02/08/2017  . Hypertension 02/08/2017  . Long-term (current) use of anticoagulants 02/08/2017  . Bradycardia 02/08/2017  . Dizziness 02/08/2017  . DOE (dyspnea on exertion) 02/08/2017  . Impaired fasting glucose 02/08/2017  . Tobacco abuse, in remission   . Cardiomyopathy-ruled out 04/12/2013  . Pulmonary embolism (HCC)   . Hypertension   . Degenerative joint disease of knee   . Atrial fibrillation (HCC)   . Gastroesophageal reflux   . Chronic anticoagulation 12/26/2012    Past Surgical History:  Procedure Laterality Date  . CATARACT EXTRACTION W/ INTRAOCULAR LENS IMPLANT Bilateral   . COLONOSCOPY  2011   Negative screening study  . VARICOSE VEIN SURGERY  1970s  . VEIN SURGERY          Home Medications    Prior to Admission medications   Medication Sig Start Date End Date Taking? Authorizing Provider  brimonidine (ALPHAGAN) 0.2 % ophthalmic solution Place 1 drop into both eyes 2 (two) times daily.    [provider]  caffeine 200 MG TABS tablet Take 200 mg by mouth every 4 (four) hours as needed.    [provider]  cephALEXin (KEFLEX) 500 MG capsule Take 1 capsule (500 mg total) by mouth 4 (four) times daily. 10/13/16   Elson Areas, PA-C  diltiazem (DILACOR XR) 180 MG 24 hr capsule Take 1 capsule (180 mg total) by mouth daily. 09/13/14   Jodelle Gross, NP  diltiazem (DILACOR XR) 180  MG 24 hr capsule Take 180 mg by mouth daily.    [provider]  dorzolamide-timolol (COSOPT) 22.3-6.8 MG/ML ophthalmic solution Place 1 drop into the left eye 2 (two) times daily.    [provider]  furosemide (LASIX) 20 MG tablet Take 1 tablet (20 mg total) by mouth daily as needed. 06/03/13   Kathlen Brunswick, MD  lisinopril (PRINIVIL,ZESTRIL) 2.5 MG tablet Take 2.5 mg by mouth at bedtime.    [provider]  LORazepam (ATIVAN) 0.5 MG tablet Take 1 tablet (0.5 mg total) by mouth 2 (two) times daily as needed (dizziness). 04/14/18    Donnetta Hutching, MD  meclizine (ANTIVERT) 12.5 MG tablet Take 1 tablet (12.5 mg total) by mouth 2 (two) times daily as needed for dizziness. 04/14/18   Donnetta Hutching, MD  metoprolol succinate (TOPROL-XL) 50 MG 24 hr tablet Take 50 mg by mouth daily. Take with or immediately following a meal.    [provider]  omeprazole (PRILOSEC) 20 MG capsule Take 20 mg by mouth daily.    [provider]  traMADol (ULTRAM) 50 MG tablet Take by mouth every 6 (six) hours as needed.    [provider]  warfarin (COUMADIN) 5 MG tablet Take 5-10 mg by mouth daily. Take 1 tablet Mon Wed and Fri. Take 1 & 1/2 tablets Sun, Tues, Thurs and Sat    [provider]    Family History Family History  Problem Relation Age of Onset  . Arthritis Mother   . Diabetes Sister   . Asthma Brother   . Atrial fibrillation Mother        Possible history of arrhythmia-patient recalls only palpitations  . Alzheimer's disease Father     Social History Social History   Tobacco Use  . Smoking status: Former Games developer  . Smokeless tobacco: Never Used  Substance Use Topics  . Alcohol use: No    Comment: quit 8 yrs ago  . Drug use: No     Allergies   Mercury   Review of Systems Review of Systems  All other systems reviewed and are negative.    Physical Exam Updated Vital Signs BP 119/64   Pulse 73   Temp 98.6 F (37 C) (Oral)   Resp (!) 26   Wt 89.8 kg   SpO2 98%   BMI 27.61 kg/m   Physical Exam  Constitutional: He is oriented to person, place, and time. He appears well-developed and well-nourished.  HENT:  Head: Normocephalic and atraumatic.  Eyes: Conjunctivae are normal.  Neck: Neck supple.  Cardiovascular: Normal rate and regular rhythm.  Pulmonary/Chest: Effort normal and breath sounds normal.  Decreased breath sounds right base  Abdominal: Soft. Bowel sounds are normal.  Musculoskeletal: Normal range of motion.  Neurological: He is alert and oriented to person, place,  and time.  Skin: Skin is warm and dry.  Psychiatric: He has a normal mood and affect. His behavior is normal.  Nursing note and vitals reviewed.    ED Treatments / Results  Labs (all labs ordered are listed, but only abnormal results are displayed) Labs Reviewed  CBC WITH DIFFERENTIAL/PLATELET - Abnormal; Notable for the following components:      Result Value   RBC 3.82 (*)    Hemoglobin 11.1 (*)    HCT 36.7 (*)    All other components within normal limits  BASIC METABOLIC PANEL - Abnormal; Notable for the following components:   Glucose, Bld 104 (*)    Creatinine, Ser  1.28 (*)    Calcium 8.0 (*)    GFR calc non Af Amer 52 (*)    All other components within normal limits  PROTIME-INR - Abnormal; Notable for the following components:   Prothrombin Time 31.5 (*)    All other components within normal limits  TROPONIN I    EKG EKG Interpretation  Date/Time:  Tuesday July 29 2018 18:58:26 EDT Ventricular Rate:  93 PR Interval:    QRS Duration: 92 QT Interval:  380 QTC Calculation: 374 R Axis:   -25 Text Interpretation:  Sinus rhythm Multiple premature complexes, vent & supraven Borderline left axis deviation Anteroseptal infarct, age indeterminate Confirmed by Donnetta Hutching ([id number]) on 07/29/2018 9:09:13 PM   Radiology Dg Chest 2 View  Result Date: 07/29/2018 CLINICAL DATA:  Short of breath for 6 weeks. EXAM: CHEST - 2 VIEW COMPARISON:  04/14/2018 chest CT. FINDINGS: Midline trachea. Mild cardiomegaly. Atherosclerosis in the transverse aorta. Small to moderate right pleural effusion, new since the prior CT. No pneumothorax. Suspect mild pulmonary venous congestion. Right base airspace disease. IMPRESSION: Cardiomegaly with suspicion of mild pulmonary venous congestion. Small to moderate right pleural effusion with adjacent Airspace disease, likely atelectasis. Electronically Signed   By: Jeronimo Greaves M.D.   On: 07/29/2018 19:44    Procedures Procedures (including critical  care time)  Medications Ordered in ED Medications - No data to display   Initial Impression / Assessment and Plan / ED Course  I have reviewed the triage vital signs and the nursing notes.  Pertinent labs & imaging results that were available during my care of the patient were reviewed by me and considered in my medical decision making (see chart for details).     Patient presents with worsening dyspnea over the past month.  He is oxygenating well.  Chest x-ray reveals a right pleural effusion.  EKG reveals ectopy, but troponin negative.  Discussed findings with the patient and his sister.  He will follow-up with his primary care doctor for further evaluation.  Final Clinical Impressions(s) / ED Diagnoses   Final diagnoses:  Pleural effusion, right    ED Discharge Orders    None       Donnetta Hutching, MD 07/29/18 2219

## 2018-07-29 NOTE — ED Triage Notes (Signed)
Pt c/o SOB X6 weeks. Denies cough, CP at this time. States SOB at all times, worse with movement.

## 2018-07-29 NOTE — Discharge Instructions (Addendum)
You have a right sided pleural effusion.  Follow-up with your primary care doctor this week for further evaluation.

## 2018-07-31 DIAGNOSIS — R0600 Dyspnea, unspecified: Secondary | ICD-10-CM | POA: Diagnosis not present

## 2018-07-31 DIAGNOSIS — J918 Pleural effusion in other conditions classified elsewhere: Secondary | ICD-10-CM | POA: Diagnosis not present

## 2018-07-31 DIAGNOSIS — I10 Essential (primary) hypertension: Secondary | ICD-10-CM | POA: Diagnosis not present

## 2018-07-31 DIAGNOSIS — I482 Chronic atrial fibrillation: Secondary | ICD-10-CM | POA: Diagnosis not present

## 2018-07-31 DIAGNOSIS — R600 Localized edema: Secondary | ICD-10-CM | POA: Diagnosis not present

## 2018-07-31 DIAGNOSIS — Z6825 Body mass index (BMI) 25.0-25.9, adult: Secondary | ICD-10-CM | POA: Diagnosis not present

## 2018-08-10 ENCOUNTER — Emergency Department (HOSPITAL_COMMUNITY): Payer: Medicare HMO

## 2018-08-10 ENCOUNTER — Inpatient Hospital Stay (HOSPITAL_COMMUNITY)
Admission: EM | Admit: 2018-08-10 | Discharge: 2018-09-09 | DRG: 163 | Disposition: E | Payer: Medicare HMO | Attending: Surgery | Admitting: Surgery

## 2018-08-10 ENCOUNTER — Encounter (HOSPITAL_COMMUNITY): Payer: Self-pay | Admitting: *Deleted

## 2018-08-10 ENCOUNTER — Other Ambulatory Visit: Payer: Self-pay

## 2018-08-10 DIAGNOSIS — E43 Unspecified severe protein-calorie malnutrition: Secondary | ICD-10-CM | POA: Diagnosis not present

## 2018-08-10 DIAGNOSIS — N179 Acute kidney failure, unspecified: Secondary | ICD-10-CM | POA: Diagnosis not present

## 2018-08-10 DIAGNOSIS — N529 Male erectile dysfunction, unspecified: Secondary | ICD-10-CM | POA: Diagnosis present

## 2018-08-10 DIAGNOSIS — I700 Atherosclerosis of aorta: Secondary | ICD-10-CM | POA: Diagnosis present

## 2018-08-10 DIAGNOSIS — Z6824 Body mass index (BMI) 24.0-24.9, adult: Secondary | ICD-10-CM

## 2018-08-10 DIAGNOSIS — I4891 Unspecified atrial fibrillation: Secondary | ICD-10-CM | POA: Diagnosis present

## 2018-08-10 DIAGNOSIS — I482 Chronic atrial fibrillation: Secondary | ICD-10-CM | POA: Diagnosis present

## 2018-08-10 DIAGNOSIS — J9601 Acute respiratory failure with hypoxia: Secondary | ICD-10-CM | POA: Diagnosis not present

## 2018-08-10 DIAGNOSIS — I2782 Chronic pulmonary embolism: Secondary | ICD-10-CM | POA: Diagnosis not present

## 2018-08-10 DIAGNOSIS — N400 Enlarged prostate without lower urinary tract symptoms: Secondary | ICD-10-CM | POA: Diagnosis not present

## 2018-08-10 DIAGNOSIS — I10 Essential (primary) hypertension: Secondary | ICD-10-CM | POA: Diagnosis present

## 2018-08-10 DIAGNOSIS — K828 Other specified diseases of gallbladder: Secondary | ICD-10-CM | POA: Diagnosis not present

## 2018-08-10 DIAGNOSIS — M545 Low back pain: Secondary | ICD-10-CM | POA: Diagnosis present

## 2018-08-10 DIAGNOSIS — Z79891 Long term (current) use of opiate analgesic: Secondary | ICD-10-CM

## 2018-08-10 DIAGNOSIS — J439 Emphysema, unspecified: Secondary | ICD-10-CM | POA: Diagnosis not present

## 2018-08-10 DIAGNOSIS — R0902 Hypoxemia: Secondary | ICD-10-CM | POA: Diagnosis not present

## 2018-08-10 DIAGNOSIS — I480 Paroxysmal atrial fibrillation: Secondary | ICD-10-CM | POA: Diagnosis not present

## 2018-08-10 DIAGNOSIS — Z87891 Personal history of nicotine dependence: Secondary | ICD-10-CM

## 2018-08-10 DIAGNOSIS — R739 Hyperglycemia, unspecified: Secondary | ICD-10-CM | POA: Diagnosis present

## 2018-08-10 DIAGNOSIS — I129 Hypertensive chronic kidney disease with stage 1 through stage 4 chronic kidney disease, or unspecified chronic kidney disease: Secondary | ICD-10-CM | POA: Diagnosis not present

## 2018-08-10 DIAGNOSIS — Z9889 Other specified postprocedural states: Secondary | ICD-10-CM

## 2018-08-10 DIAGNOSIS — J9811 Atelectasis: Secondary | ICD-10-CM | POA: Diagnosis present

## 2018-08-10 DIAGNOSIS — K219 Gastro-esophageal reflux disease without esophagitis: Secondary | ICD-10-CM | POA: Diagnosis present

## 2018-08-10 DIAGNOSIS — Z789 Other specified health status: Secondary | ICD-10-CM

## 2018-08-10 DIAGNOSIS — J90 Pleural effusion, not elsewhere classified: Secondary | ICD-10-CM | POA: Diagnosis not present

## 2018-08-10 DIAGNOSIS — I959 Hypotension, unspecified: Secondary | ICD-10-CM | POA: Diagnosis not present

## 2018-08-10 DIAGNOSIS — R791 Abnormal coagulation profile: Secondary | ICD-10-CM | POA: Diagnosis not present

## 2018-08-10 DIAGNOSIS — I509 Heart failure, unspecified: Secondary | ICD-10-CM | POA: Diagnosis present

## 2018-08-10 DIAGNOSIS — N183 Chronic kidney disease, stage 3 unspecified: Secondary | ICD-10-CM | POA: Diagnosis present

## 2018-08-10 DIAGNOSIS — R0602 Shortness of breath: Secondary | ICD-10-CM | POA: Diagnosis not present

## 2018-08-10 DIAGNOSIS — J942 Hemothorax: Principal | ICD-10-CM | POA: Diagnosis present

## 2018-08-10 DIAGNOSIS — Z7901 Long term (current) use of anticoagulants: Secondary | ICD-10-CM | POA: Diagnosis not present

## 2018-08-10 DIAGNOSIS — R7301 Impaired fasting glucose: Secondary | ICD-10-CM | POA: Diagnosis not present

## 2018-08-10 DIAGNOSIS — J80 Acute respiratory distress syndrome: Secondary | ICD-10-CM | POA: Diagnosis not present

## 2018-08-10 DIAGNOSIS — I130 Hypertensive heart and chronic kidney disease with heart failure and stage 1 through stage 4 chronic kidney disease, or unspecified chronic kidney disease: Secondary | ICD-10-CM | POA: Diagnosis present

## 2018-08-10 DIAGNOSIS — D649 Anemia, unspecified: Secondary | ICD-10-CM | POA: Diagnosis present

## 2018-08-10 DIAGNOSIS — E872 Acidosis: Secondary | ICD-10-CM | POA: Diagnosis not present

## 2018-08-10 DIAGNOSIS — J939 Pneumothorax, unspecified: Secondary | ICD-10-CM | POA: Diagnosis not present

## 2018-08-10 DIAGNOSIS — I2699 Other pulmonary embolism without acute cor pulmonale: Secondary | ICD-10-CM | POA: Diagnosis not present

## 2018-08-10 DIAGNOSIS — Z01818 Encounter for other preprocedural examination: Secondary | ICD-10-CM

## 2018-08-10 DIAGNOSIS — R17 Unspecified jaundice: Secondary | ICD-10-CM | POA: Diagnosis present

## 2018-08-10 DIAGNOSIS — I2510 Atherosclerotic heart disease of native coronary artery without angina pectoris: Secondary | ICD-10-CM | POA: Diagnosis present

## 2018-08-10 DIAGNOSIS — Z9841 Cataract extraction status, right eye: Secondary | ICD-10-CM

## 2018-08-10 DIAGNOSIS — Z91048 Other nonmedicinal substance allergy status: Secondary | ICD-10-CM

## 2018-08-10 DIAGNOSIS — R069 Unspecified abnormalities of breathing: Secondary | ICD-10-CM | POA: Diagnosis not present

## 2018-08-10 DIAGNOSIS — G8929 Other chronic pain: Secondary | ICD-10-CM | POA: Diagnosis present

## 2018-08-10 DIAGNOSIS — R846 Abnormal cytological findings in specimens from respiratory organs and thorax: Secondary | ICD-10-CM | POA: Diagnosis not present

## 2018-08-10 DIAGNOSIS — Z833 Family history of diabetes mellitus: Secondary | ICD-10-CM

## 2018-08-10 DIAGNOSIS — Z79899 Other long term (current) drug therapy: Secondary | ICD-10-CM

## 2018-08-10 DIAGNOSIS — Z961 Presence of intraocular lens: Secondary | ICD-10-CM | POA: Diagnosis present

## 2018-08-10 DIAGNOSIS — Z9842 Cataract extraction status, left eye: Secondary | ICD-10-CM

## 2018-08-10 LAB — COMPREHENSIVE METABOLIC PANEL
ALBUMIN: 3.2 g/dL — AB (ref 3.5–5.0)
ALK PHOS: 46 U/L (ref 38–126)
ALT: 8 U/L (ref 0–44)
ANION GAP: 12 (ref 5–15)
AST: 13 U/L — AB (ref 15–41)
BUN: 50 mg/dL — AB (ref 8–23)
CALCIUM: 8.8 mg/dL — AB (ref 8.9–10.3)
CO2: 24 mmol/L (ref 22–32)
Chloride: 105 mmol/L (ref 98–111)
Creatinine, Ser: 1.54 mg/dL — ABNORMAL HIGH (ref 0.61–1.24)
GFR calc Af Amer: 48 mL/min — ABNORMAL LOW (ref >60)
GFR, EST NON AFRICAN AMERICAN: 42 mL/min — AB (ref >60)
GLUCOSE: 109 mg/dL — AB (ref 70–99)
POTASSIUM: 4.6 mmol/L (ref 3.5–5.1)
Sodium: 141 mmol/L (ref 135–145)
Total Bilirubin: 2.2 mg/dL — ABNORMAL HIGH (ref 0.3–1.2)
Total Protein: 6.9 g/dL (ref 6.5–8.1)

## 2018-08-10 LAB — CBC WITH DIFFERENTIAL/PLATELET
Basophils Absolute: 0 10*3/uL (ref 0.0–0.1)
Basophils Relative: 1 %
Eosinophils Absolute: 0.1 10*3/uL (ref 0.0–0.7)
Eosinophils Relative: 2 %
HCT: 35.2 % — ABNORMAL LOW (ref 39.0–52.0)
HEMOGLOBIN: 10.7 g/dL — AB (ref 13.0–17.0)
LYMPHS ABS: 0.8 10*3/uL (ref 0.7–4.0)
Lymphocytes Relative: 14 %
MCH: 28.7 pg (ref 26.0–34.0)
MCHC: 30.4 g/dL (ref 30.0–36.0)
MCV: 94.4 fL (ref 78.0–100.0)
MONO ABS: 0.6 10*3/uL (ref 0.1–1.0)
Monocytes Relative: 11 %
Neutro Abs: 4 10*3/uL (ref 1.7–7.7)
Neutrophils Relative %: 72 %
Platelets: 402 10*3/uL — ABNORMAL HIGH (ref 150–400)
RBC: 3.73 MIL/uL — ABNORMAL LOW (ref 4.22–5.81)
RDW: 14.4 % (ref 11.5–15.5)
WBC: 5.4 10*3/uL (ref 4.0–10.5)

## 2018-08-10 LAB — TROPONIN I: Troponin I: <0.03 ng/mL (ref <0.03)

## 2018-08-10 LAB — BRAIN NATRIURETIC PEPTIDE: B NATRIURETIC PEPTIDE 5: 58 pg/mL (ref 0.0–100.0)

## 2018-08-10 LAB — PROTIME-INR: Prothrombin Time: >90 seconds — ABNORMAL HIGH (ref 11.4–15.2)

## 2018-08-10 MED ORDER — ACETAMINOPHEN 325 MG PO TABS: 650.0000 mg | ORAL_TABLET | Freq: Four times a day (QID) | ORAL | Status: DC | PRN

## 2018-08-10 MED ORDER — LACTATED RINGERS IV SOLN
INTRAVENOUS | Status: DC
  Administered 2018-08-10 – 2018-08-11 (×2): via INTRAVENOUS

## 2018-08-10 MED ORDER — LORAZEPAM 0.5 MG PO TABS
0.5000 mg | ORAL_TABLET | Freq: Two times a day (BID) | ORAL | Status: DC | PRN
  Administered 2018-08-10: 0.5 mg via ORAL
  Filled 2018-08-10: qty 1

## 2018-08-10 MED ORDER — ONDANSETRON HCL 4 MG/2ML IJ SOLN: 4.0000 mg | Freq: Four times a day (QID) | INTRAMUSCULAR | Status: DC | PRN

## 2018-08-10 MED ORDER — TRAMADOL HCL 50 MG PO TABS
50.0000 mg | ORAL_TABLET | Freq: Four times a day (QID) | ORAL | Status: DC | PRN
  Administered 2018-08-11 – 2018-08-13 (×2): 50 mg via ORAL
  Filled 2018-08-10 (×2): qty 1

## 2018-08-10 MED ORDER — ENSURE ENLIVE PO LIQD
237.0000 mL | Freq: Two times a day (BID) | ORAL | Status: DC
  Administered 2018-08-11 – 2018-08-13 (×2): 237 mL via ORAL

## 2018-08-10 MED ORDER — DILTIAZEM HCL ER COATED BEADS 180 MG PO CP24
180.0000 mg | ORAL_CAPSULE | Freq: Every day | ORAL | Status: DC
  Administered 2018-08-11 – 2018-08-14 (×4): 180 mg via ORAL
  Filled 2018-08-10 (×4): qty 1

## 2018-08-10 MED ORDER — ACETAMINOPHEN 650 MG RE SUPP: 650.0000 mg | Freq: Four times a day (QID) | RECTAL | Status: DC | PRN

## 2018-08-10 MED ORDER — DOCUSATE SODIUM 100 MG PO CAPS
100.0000 mg | ORAL_CAPSULE | Freq: Two times a day (BID) | ORAL | Status: DC
  Administered 2018-08-10 – 2018-08-14 (×7): 100 mg via ORAL
  Filled 2018-08-10 (×7): qty 1

## 2018-08-10 MED ORDER — ONDANSETRON HCL 4 MG PO TABS: 4.0000 mg | ORAL_TABLET | Freq: Four times a day (QID) | ORAL | Status: DC | PRN

## 2018-08-10 MED ORDER — METOPROLOL SUCCINATE ER 50 MG PO TB24
50.0000 mg | ORAL_TABLET | Freq: Every day | ORAL | Status: DC
  Administered 2018-08-11 – 2018-08-14 (×4): 50 mg via ORAL
  Filled 2018-08-10 (×4): qty 1

## 2018-08-10 MED ORDER — MECLIZINE HCL 12.5 MG PO TABS: 12.5000 mg | ORAL_TABLET | Freq: Three times a day (TID) | ORAL | Status: DC | PRN

## 2018-08-10 MED ORDER — VITAMIN K1 10 MG/ML IJ SOLN
10.0000 mg | Freq: Once | INTRAMUSCULAR | Status: AC
  Administered 2018-08-10: 10 mg via SUBCUTANEOUS
  Filled 2018-08-10: qty 1

## 2018-08-10 NOTE — ED Notes (Signed)
Call from lab:  Critical value  INR greater than 10  Dr Ranae Palms informed

## 2018-08-10 NOTE — ED Provider Notes (Signed)
[facility] EMERGENCY DEPARTMENT Provider Note   CSN: [id number] Arrival date & time: 08/22/2018  1524     History   Chief Complaint Chief Complaint  Patient presents with  . Shortness of Breath    HPI Allen Marshall is a 77 y.o. male.  HPI Patient presents with several weeks of worsening shortness of breath.  States the shortness of breath is worse with minimal exertion.  Denies cough, fever or chills.  No chest pain.  Has bilateral lower extremity swelling.  States he takes Lasix and has an appointment to follow-up with a cardiologist. Past Medical History:  Diagnosis Date  . Allergic reaction 06/17/2012   Oral edema; possibly food related  . Anemia    05/2011: H&H-10.2/31.3, MCV-92  . Arthritis   . Atrial fibrillation (HCC) <2011   Arrhythmia of uncertain duration, but it least one year; additional records pending as of 03/2013  . Benign prostatic hypertrophy   . Chronic anticoagulation   . Chronic kidney disease    05/2011: BUN/Cr- 15/1.24, GFR-68  . Degenerative joint disease of knee    Bilateral; also hips  . Erectile dysfunction   . Essential hypertension   . Gastroesophageal reflux    Frequent pyrosis  . Insomnia    Difficulty falling asleep  . Leg edema, right Chronic   Status post venectomy 1990s  . Low back pain   . Pulmonary embolism (HCC) 2003, 2014   Recurrent pulmonary embolism with subtherapeutic INR in 12/2012  . Tobacco abuse, in remission    40 pack years; discontinued 2006; office spirometry in 2012: Nl ex FEF 25-75%    Patient Active Problem List   Diagnosis Date Noted  . GERD (gastroesophageal reflux disease) 02/08/2017  . Chronic pulmonary embolism (HCC) 02/08/2017  . 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 Chronic atrial fibrillation (HCC) 02/08/2017  . Elevated PSA 02/08/2017  . Low back pain 02/08/2017  . Hypertension 02/08/2017  . Long-term (current) use of anticoagulants 02/08/2017  . Bradycardia 02/08/2017  . Dizziness 02/08/2017  . DOE (dyspnea  on exertion) 02/08/2017  . Impaired fasting glucose 02/08/2017  . Tobacco abuse, in remission   . Cardiomyopathy-ruled out 04/12/2013  . Pulmonary embolism (HCC)   . Hypertension   . Degenerative joint disease of knee   . Atrial fibrillation (HCC)   . Gastroesophageal reflux   . Chronic anticoagulation 12/26/2012    Past Surgical History:  Procedure Laterality Date  . CATARACT EXTRACTION W/ INTRAOCULAR LENS IMPLANT Bilateral   . COLONOSCOPY  2011   Negative screening study  . VARICOSE VEIN SURGERY  1970s  . VEIN SURGERY          Home Medications    Prior to Admission medications   Medication Sig Start Date End Date Taking? Authorizing Provider  furosemide (LASIX) 20 MG tablet Take 1 tablet (20 mg total) by mouth daily as needed. Patient taking differently: Take 20 mg by mouth daily.  06/03/13  Yes Rothbart, Gerrit Friends, MD  meclizine (ANTIVERT) 12.5 MG tablet Take 1 tablet (12.5 mg total) by mouth 2 (two) times daily as needed for dizziness. Patient taking differently: Take 12.5 mg by mouth 3 (three) times daily as needed for dizziness.  04/14/18  Yes Donnetta Hutching, MD  warfarin (COUMADIN) 5 MG tablet Take 5-10 mg by mouth daily. 7.5mg  on Mondays and Thursdays. 5mg  on all other days   Yes [provider]  diltiazem (DILACOR XR) 180 MG 24 hr capsule Take 1 capsule (180 mg total) by mouth daily.  09/13/14   Jodelle Gross, NP  lisinopril (PRINIVIL,ZESTRIL) 2.5 MG tablet Take 2.5 mg by mouth at bedtime.    [provider]  LORazepam (ATIVAN) 0.5 MG tablet Take 1 tablet (0.5 mg total) by mouth 2 (two) times daily as needed (dizziness). 04/14/18   Donnetta Hutching, MD  metoprolol succinate (TOPROL-XL) 50 MG 24 hr tablet Take 50 mg by mouth daily. Take with or immediately following a meal.    [provider]  traMADol (ULTRAM) 50 MG tablet Take by mouth every 6 (six) hours as needed.    [provider]    Family History Family History  Problem Relation Age  of Onset  . Arthritis Mother   . Diabetes Sister   . Asthma Brother   . Atrial fibrillation Mother        Possible history of arrhythmia-patient recalls only palpitations  . Alzheimer's disease Father     Social History Social History   Tobacco Use  . Smoking status: Former Games developer  . Smokeless tobacco: Never Used  Substance Use Topics  . Alcohol use: No    Comment: quit 8 yrs ago  . Drug use: No     Allergies   Mercury   Review of Systems Review of Systems  Constitutional: Negative for chills and fever.  HENT: Negative for trouble swallowing.   Respiratory: Positive for shortness of breath. Negative for cough and wheezing.   Cardiovascular: Positive for leg swelling. Negative for chest pain and palpitations.  Gastrointestinal: Negative for abdominal pain, constipation, diarrhea, nausea and vomiting.  Genitourinary: Negative for flank pain and frequency.  Musculoskeletal: Negative for back pain, myalgias and neck pain.  Neurological: Negative for dizziness, weakness, light-headedness, numbness and headaches.  All other systems reviewed and are negative.    Physical Exam Updated Vital Signs BP 132/75   Pulse 78   Temp 97.9 F (36.6 C) (Oral)   Resp (!) 25   Ht 5\' 11"  (1.803 m)   Wt 84.4 kg   SpO2 94%   BMI 25.94 kg/m   Physical Exam  Constitutional: He is oriented to person, place, and time. He appears well-developed and well-nourished. No distress.  HENT:  Head: Normocephalic and atraumatic.  Mouth/Throat: Oropharynx is clear and moist. No oropharyngeal exudate.  Eyes: Pupils are equal, round, and reactive to light. EOM are normal.  Neck: Normal range of motion. Neck supple. No JVD present.  Cardiovascular: Normal rate and regular rhythm. Exam reveals no gallop and no friction rub.  No murmur heard. Pulmonary/Chest: Effort normal.  Diminished breath sounds in the right lung field  Abdominal: Soft. Bowel sounds are normal. There is no tenderness. There is  no rebound and no guarding.  Musculoskeletal: Normal range of motion. He exhibits edema. He exhibits no tenderness.  2+ bilateral lower extremity pitting edema.  No calf asymmetry or tenderness.  Neurological: He is alert and oriented to person, place, and time.  Skin: Skin is warm and dry. Capillary refill takes less than 2 seconds. No rash noted. He is not diaphoretic. No erythema.  Psychiatric: He has a normal mood and affect. His behavior is normal.  Nursing note and vitals reviewed.    ED Treatments / Results  Labs (all labs ordered are listed, but only abnormal results are displayed) Labs Reviewed  CBC WITH DIFFERENTIAL/PLATELET - Abnormal; Notable for the following components:      Result Value   RBC 3.73 (*)    Hemoglobin 10.7 (*)    HCT 35.2 (*)  Platelets 402 (*)    All other components within normal limits  COMPREHENSIVE METABOLIC PANEL - Abnormal; Notable for the following components:   Glucose, Bld 109 (*)    BUN 50 (*)    Creatinine, Ser 1.54 (*)    Calcium 8.8 (*)    Albumin 3.2 (*)    AST 13 (*)    Total Bilirubin 2.2 (*)    GFR calc non Af Amer 42 (*)    GFR calc Af Amer 48 (*)    All other components within normal limits  PROTIME-INR - Abnormal; Notable for the following components:   Prothrombin Time >90.0 (*)    INR >10.00 (*)    All other components within normal limits  BRAIN NATRIURETIC PEPTIDE  TROPONIN I    EKG EKG Interpretation  Date/Time:  Sunday 22-Aug-2018 15:43:23 EDT Ventricular Rate:  80 PR Interval:    QRS Duration: 91 QT Interval:  404 QTC Calculation: 466 R Axis:   49 Text Interpretation:  Sinus rhythm Confirmed by Loren Racer [phone]) on 2018/08/22 3:58:56 PM   Radiology Dg Chest 2 View  Result Date: 2018/08/22 CLINICAL DATA:  Patient with worsening shortness of breath. EXAM: CHEST - 2 VIEW COMPARISON:  Chest radiograph 07/29/2018 FINDINGS: Monitoring leads overlie the patient. Stable cardiomegaly. Aortic  atherosclerosis. Interval increase in size of large right pleural effusion with underlying consolidation. No pneumothorax. Left lung is clear. IMPRESSION: Interval increase in size of large effusion with underlying consolidation. Electronically Signed   By: Annia Belt M.D.   On: 2018-08-22 16:42    Procedures Procedures (including critical care time)  Medications Ordered in ED Medications  phytonadione (VITAMIN K) SQ injection 10 mg (has no administration in time range)     Initial Impression / Assessment and Plan / ED Course  I have reviewed the triage vital signs and the nursing notes.  Pertinent labs & imaging results that were available during my care of the patient were reviewed by me and considered in my medical decision making (see chart for details).    Large right-sided pleural effusion.  Patient's INR is greater than 10.  Will likely need thoracentesis.  Discussed with hospitalist.  We will go ahead and give vitamin K.   Final Clinical Impressions(s) / ED Diagnoses   Final diagnoses:  Pleural effusion on right  Supratherapeutic INR    ED Discharge Orders    None       Loren Racer, MD 08/22/2018 [phone]

## 2018-08-10 NOTE — ED Triage Notes (Signed)
Patient called EMS for worsening shortness of breath, stated been going on for at least a few weeks "or more".  Patient is not on oxygen at home. EMS reports patient oxygen saturation 89% at room air.

## 2018-08-10 NOTE — H&P (Signed)
History and Physical    Allen Marshall:[id number] DOB: 1941/06/22 DOA: 08/30/2018  PCP: Benita Stabile, MD Consultants:  None Patient coming from:  Home - lives alone; NOK: Sister, [phone]  Chief Complaint: SOB  HPI: Allen Marshall is a 77 y.o. male with medical history significant of recurrent PE, on Coumadin; HTN;  Stage 2 CKD; afib; and BPH presenting with SOB.  He reports that he was "short of breath, couldn't breathe".  When he first noticed it, it was when he was in the [employer] but it was intermittent.  2 weeks ago, it became constant.  It has gradually gotten worse in that period of time.  He feels SOB all the time.  It does get worse with exertion.  No cough, no pain.  No wheezing.  He came here recently - was told he had an effusion and told to go see his PCP.  His BP meds were making him dizzy so they changed it recently.  Denies dietary or other medication changes.  He was seen in the ER on 8/20 for SOB.  He was oxygenating well at that time but did have a small to moderate right pleural effusion.   He was discharged with plan for outpatient f/u.   ED Course:  Large right pleural effusion.  Desats into 80s, SOB with minimal exertion. INR >10.  Likely to need thoracentesis.  Will give vitamin K.  Review of Systems: As per HPI; otherwise review of systems reviewed and negative.   Ambulatory Status:  Ambulates without assistance  Past Medical History:  Diagnosis Date  . Allergic reaction 06/17/2012   Oral edema; possibly food related  . Anemia    05/2011: H&H-10.2/31.3, MCV-92  . Arthritis   . Atrial fibrillation (HCC) <2011   Arrhythmia of uncertain duration, but it least one year; additional records pending as of 03/2013  . Benign prostatic hypertrophy   . Chronic anticoagulation   . Chronic kidney disease    05/2011: BUN/Cr- 15/1.24, GFR-68  . Degenerative joint disease of knee    Bilateral; also hips  . Erectile dysfunction   . Essential hypertension   .  Gastroesophageal reflux    Frequent pyrosis  . Insomnia    Difficulty falling asleep  . Leg edema, right Chronic   Status post venectomy 1990s  . Low back pain   . Pulmonary embolism (HCC) 2003, 2014   Recurrent pulmonary embolism with subtherapeutic INR in 12/2012  . Tobacco abuse, in remission    40 pack years; discontinued 2006; office spirometry in 2012: Nl ex FEF 25-75%    Past Surgical History:  Procedure Laterality Date  . CATARACT EXTRACTION W/ INTRAOCULAR LENS IMPLANT Bilateral   . COLONOSCOPY  2011   Negative screening study  . VARICOSE VEIN SURGERY  1970s  . VEIN SURGERY      Social History   Socioeconomic History  . Marital status: Widowed    Spouse name: Not on file  . Number of children: Not on file  . Years of education: Not on file  . Highest education level: Not on file  Occupational History  . Occupation: Retired    Comment: Loss adjuster, chartered  . Financial resource strain: Not on file  . Food insecurity:    Worry: Not on file    Inability: Not on file  . Transportation needs:    Medical: Not on file    Non-medical: Not on file  Tobacco Use  . Smoking status: Former  Smoker  . Smokeless tobacco: Never Used  Substance and Sexual Activity  . Alcohol use: No    Comment: quit 8 yrs ago  . Drug use: No  . Sexual activity: Not Currently  Lifestyle  . Physical activity:    Days per week: Not on file    Minutes per session: Not on file  . Stress: Not on file  Relationships  . Social connections:    Talks on phone: Not on file    Gets together: Not on file    Attends religious service: Not on file    Active member of club or organization: Not on file    Attends meetings of clubs or organizations: Not on file    Relationship status: Not on file  . Intimate partner violence:    Fear of current or ex partner: Not on file    Emotionally abused: Not on file    Physically abused: Not on file    Forced sexual activity: Not on file  Other Topics  Concern  . Not on file  Social History Narrative   ** Merged History Encounter **        Allergies  Allergen Reactions  . Mercury Hives    Family History  Problem Relation Age of Onset  . Arthritis Mother   . Diabetes Sister   . Asthma Brother   . Atrial fibrillation Mother        Possible history of arrhythmia-patient recalls only palpitations  . Alzheimer's disease Father     Prior to Admission medications   Medication Sig Start Date End Date Taking? Authorizing Provider  furosemide (LASIX) 20 MG tablet Take 1 tablet (20 mg total) by mouth daily as needed. Patient taking differently: Take 20 mg by mouth daily.  06/03/13  Yes Rothbart, Gerrit Friends, MD  meclizine (ANTIVERT) 12.5 MG tablet Take 1 tablet (12.5 mg total) by mouth 2 (two) times daily as needed for dizziness. Patient taking differently: Take 12.5 mg by mouth 3 (three) times daily as needed for dizziness.  04/14/18  Yes Donnetta Hutching, MD  warfarin (COUMADIN) 5 MG tablet Take 5-10 mg by mouth daily. 7.5mg  on Mondays and Thursdays. 5mg  on all other days   Yes [provider]  diltiazem (DILACOR XR) 180 MG 24 hr capsule Take 1 capsule (180 mg total) by mouth daily. 09/13/14   Jodelle Gross, NP  lisinopril (PRINIVIL,ZESTRIL) 2.5 MG tablet Take 2.5 mg by mouth at bedtime.    [provider]  LORazepam (ATIVAN) 0.5 MG tablet Take 1 tablet (0.5 mg total) by mouth 2 (two) times daily as needed (dizziness). 04/14/18   Donnetta Hutching, MD  metoprolol succinate (TOPROL-XL) 50 MG 24 hr tablet Take 50 mg by mouth daily. Take with or immediately following a meal.    [provider]  traMADol (ULTRAM) 50 MG tablet Take by mouth every 6 (six) hours as needed.    [provider]    Physical Exam: Vitals:   08/20/2018 1800 09/06/2018 1830 08/22/2018 1930 08/23/2018 2000  BP: 119/76 119/72 131/79 119/73  Pulse: 78 82 85 92  Resp: (!) 24 20 (!) 23 (!) 24  Temp:    97.8 F (36.6 C)  TempSrc:    Oral  SpO2: 96%  96% 94% 97%  Weight:      Height:         General:  Appears calm and comfortable and is NAD Eyes:  PERRL, EOMI, normal lids, iris ENT:  grossly normal hearing,  lips & tongue, mmm; edentulous Neck:  no LAD, masses or thyromegaly Cardiovascular:  RRR, no m/r/g. Chronic RLE edema s/p vein stripping; no LLE edema.  Respiratory:   Diminished breath sounds 1/2 up lung field on the right.  Normal respiratory effort. Abdomen:  soft, NT, ND, NABS Back:   normal alignment, no CVAT Skin:  no rash or induration seen on limited exam Musculoskeletal:  grossly normal tone BUE/BLE, good ROM, no bony abnormality Psychiatric:  grossly normal mood and affect, speech fluent and appropriate, AOx3 Neurologic:  CN 2-12 grossly intact, moves all extremities in coordinated fashion, sensation intact    Radiological Exams on Admission: Dg Chest 2 View  Result Date: August 19, 2018 CLINICAL DATA:  Patient with worsening shortness of breath. EXAM: CHEST - 2 VIEW COMPARISON:  Chest radiograph 07/29/2018 FINDINGS: Monitoring leads overlie the patient. Stable cardiomegaly. Aortic atherosclerosis. Interval increase in size of large right pleural effusion with underlying consolidation. No pneumothorax. Left lung is clear. IMPRESSION: Interval increase in size of large effusion with underlying consolidation. Electronically Signed   By: Annia Belt M.D.   On: Aug 19, 2018 16:42    EKG: Independently reviewed.  NSR with rate 80;  no evidence of acute ischemia   Labs on Admission: I have personally reviewed the available labs and imaging studies at the time of the admission.  Pertinent labs:   Glucose 109 BN 50/Creatinine 1.54/GFR 48 - baseline appears to be creatinine 1.25/GFR >60 Albumin 3.2 BNP 58/0 Troponin <0.03 WBC 5.4 Hgb 10.7 INR >10.0  Assessment/Plan Principal Problem:   Pleural effusion Active Problems:   Chronic anticoagulation   Atrial fibrillation (HCC)   Chronic pulmonary embolism (HCC)    Hypertension   Impaired fasting glucose   CKD (chronic kidney disease), stage III (HCC)   Supratherapeutic INR   Pleural effusion -Patient with progressive pleural effusion since 8/20 -Now presenting with large right-sided effusion  -Possible underlying consolidation, but he does not have any infectious symptoms or other symptoms concerning for PNA -He is having SOB which is likely related to the effusion -Will consult IR for both diagnostic and therapeutic INR - will request that the pleural fluid be sent for cell count; gram stain; culture; protein; glucose; LDH; pH; and cytology. -Based on his supratherapeutic INR (see below), it seems likely he will need >2 MN hospitalization to address this issue -NPO after midnight in case of possible procedure  Chronic anticoagulation with a supratherapeutic INR -Patient with h/o recurrent PE as well as afib -He is recommended to take lifelong anticoagulation -He denies recent dietary changes or medication changes that seem likely to cause a supratherapeutic INR, but today's INR is >10; it was 3.07 on 8/20 -He was given SQ vitamin K in the ER -Will recheck INR in the AM  HTN -Hold lisinopril based on renal function -Continue Toprol XL  Impaired fasting glucose -Current hyperglycemia may be stress response -Will follow with fasting AM labs -It is unlikely that he will need acute or chronic treatment for this issue  CKD -Appears slightly worse than usual baseline but not consistent with AKI at this time -Hold diuretics as well as ACE and follow   DVT prophylaxis: Holding Coumadin - supratherapeutic Code Status:  Full - confirmed with patient/family Family Communication: Niece present throughout evaluation  Disposition Plan:  Home once clinically improved Consults called: IR  Admission status: Admit - It is my clinical opinion that admission to INPATIENT is reasonable and necessary because of the expectation that this patient will require  hospital care that crosses at least 2 midnights to treat this condition based on the medical complexity of the problems presented.  Given the aforementioned information, the predictability of an adverse outcome is felt to be significant.     Jonah Blue MD [facility]  If note is complete, please contact covering daytime or nighttime physician. [email or web address] Password [facility]  08/20/2018, 8:39 PM

## 2018-08-11 DIAGNOSIS — E43 Unspecified severe protein-calorie malnutrition: Secondary | ICD-10-CM

## 2018-08-11 LAB — BASIC METABOLIC PANEL
Anion gap: 10 (ref 5–15)
BUN: 51 mg/dL — AB (ref 8–23)
CALCIUM: 8.7 mg/dL — AB (ref 8.9–10.3)
CO2: 26 mmol/L (ref 22–32)
Chloride: 105 mmol/L (ref 98–111)
Creatinine, Ser: 1.61 mg/dL — ABNORMAL HIGH (ref 0.61–1.24)
GFR calc Af Amer: 46 mL/min — ABNORMAL LOW (ref >60)
GFR, EST NON AFRICAN AMERICAN: 40 mL/min — AB (ref >60)
GLUCOSE: 104 mg/dL — AB (ref 70–99)
Potassium: 4.1 mmol/L (ref 3.5–5.1)
Sodium: 141 mmol/L (ref 135–145)

## 2018-08-11 LAB — CBC
HCT: 32.4 % — ABNORMAL LOW (ref 39.0–52.0)
HEMOGLOBIN: 9.8 g/dL — AB (ref 13.0–17.0)
MCH: 28.7 pg (ref 26.0–34.0)
MCHC: 30.2 g/dL (ref 30.0–36.0)
MCV: 95 fL (ref 78.0–100.0)
Platelets: 413 10*3/uL — ABNORMAL HIGH (ref 150–400)
RBC: 3.41 MIL/uL — ABNORMAL LOW (ref 4.22–5.81)
RDW: 14.5 % (ref 11.5–15.5)
WBC: 5.5 10*3/uL (ref 4.0–10.5)

## 2018-08-11 LAB — PROTIME-INR: PROTHROMBIN TIME: 85.3 s — AB (ref 11.4–15.2)

## 2018-08-11 MED ORDER — VITAMIN K1 10 MG/ML IJ SOLN
10.0000 mg | Freq: Once | INTRAVENOUS | Status: AC
  Administered 2018-08-11: 10 mg via INTRAVENOUS
  Filled 2018-08-11: qty 1

## 2018-08-11 NOTE — Progress Notes (Signed)
PROGRESS NOTE    Allen Marshall  GPQ:[id number] DOB: [birth year] DOA: 09/02/2018 PCP: Celene Squibb, MD    Brief Narrative:  77 year old male with a history of recurrent PE, paroxysmal A. fib, hypertension, admitted to the hospital with shortness of breath and found to have large right pleural effusion.  INR noted to be greater than 10.  Admitted for reversal of coagulopathy and thoracentesis.   Assessment & Plan:   Principal Problem:   Pleural effusion Active Problems:   Chronic anticoagulation   Atrial fibrillation (HCC)   Chronic pulmonary embolism (HCC)   Hypertension   Impaired fasting glucose   CKD (chronic kidney disease), stage III (HCC)   Supratherapeutic INR   1. Large right pleural effusion.  First noted on chest x-ray from 8/20.  There is possible underlying consolidation, but the patient does not have any significant infectious symptoms.  He does not have any fever, cough or leukocytosis.  Thoracentesis has been requested, but cannot be performed until coagulopathy has improved.  BNP is normal.  Fluid will need to be sent for cell count as well as cytology.  May benefit from CT of chest after thoracentesis has been performed. 2. Chronic anticoagulation with supratherapeutic INR.  INR noted to be greater than 10 on admission.  He received a dose of subcutaneous vitamin K.  Today, INR remains at 11.  He will receive another dose of vitamin K today.  Once feasible to resume anticoagulation, may benefit from transitioning to Eliquis 3. Paroxysmal atrial fibrillation.  Initially presented with sinus rhythm.  He is now noted to be in atrial fibrillation, likely precipitated by respiratory compromise related to pleural effusion.  He is continued on his home dose of metoprolol and Cardizem.  Blood pressures are marginal.  Continue to monitor.  If heart rates remain uncontrolled, may need to consider amiodarone. 4. History of recurrent PE.  Currently anticoagulated.  Once thoracentesis  has been performed, can likely resume anticoagulation.  May benefit from starting Eliquis. 5. Hypertension.  Holding lisinopril based on renal function.  Continued on metoprolol and diltiazem. 6. Chronic kidney disease stage III.  Creatinine appears to be mildly worse at 1.5-1.6.  Diuretics and ACE inhibitor currently on hold.  Would be reluctant to give aggressive hydration at this time with significant pleural effusion.  If renal function does not improve by tomorrow, can consider starting IV fluids after thoracentesis has been performed. 7. Hyperbilirubinemia.  No complaints of abdominal pain.  Will check right upper quadrant ultrasound.  Transaminases/alk phos are normal.   DVT prophylaxis: Coumadin Code Status: Full code Family Communication: No family present Disposition Plan: Discharge home once improved   Consultants:     Procedures:     Antimicrobials:      Subjective: Feels more short of breath today.  Has supplemental oxygen.  Sitting up in bed.  No cough.  Objective: Vitals:   08/11/18 0618 08/11/18 0620 08/11/18 0749 08/11/18 0753  BP: 99/60 99/60 103/70 103/70  Pulse: 72 (!) 122 (!) 129 (!) 120  Resp: (!) 24 (!) 22  20  Temp: 98.6 F (37 C) 98.6 F (37 C) 98.1 F (36.7 C) 98.1 F (36.7 C)  TempSrc: Oral Oral Oral Oral  SpO2:  93%  96%  Weight:      Height:        Intake/Output Summary (Last 24 hours) at 08/11/2018 1218 Last data filed at 08/11/2018 0700 Gross per 24 hour  Intake 1067.25 ml  Output 225 ml  Net 842.25 ml   Filed Weights   08/18/2018 1548  Weight: 84.4 kg    Examination:  General exam: Appears calm and comfortable  Respiratory system: Diminished breath sounds on the right. Respiratory effort normal. Cardiovascular system: S1 & S2 heard, irregularly irregular. No JVD, murmurs, rubs, gallops or clicks. No pedal edema. Gastrointestinal system: Abdomen is nondistended, soft and nontender. No organomegaly or masses felt. Normal bowel  sounds heard. Central nervous system: Alert and oriented. No focal neurological deficits. Extremities: Symmetric 5 x 5 power. Skin: No rashes, lesions or ulcers Psychiatry: Judgement and insight appear normal. Mood & affect appropriate.     Data Reviewed: I have personally reviewed following labs and imaging studies  CBC: Recent Labs  Lab 09/04/2018 1610 08/11/18 0456  WBC 5.4 5.5  NEUTROABS 4.0  --   HGB 10.7* 9.8*  HCT 35.2* 32.4*  MCV 94.4 95.0  PLT 402* 242*   Basic Metabolic Panel: Recent Labs  Lab 08/24/2018 1610 08/11/18 0456  NA 141 141  K 4.6 4.1  CL 105 105  CO2 24 26  GLUCOSE 109* 104*  BUN 50* 51*  CREATININE 1.54* 1.61*  CALCIUM 8.8* 8.7*   GFR: Estimated Creatinine Clearance: 40.9 mL/min (A) (by C-G formula based on SCr of 1.61 mg/dL (H)). Liver Function Tests: Recent Labs  Lab 08/13/2018 1610  AST 13*  ALT 8  ALKPHOS 46  BILITOT 2.2*  PROT 6.9  ALBUMIN 3.2*   No results for input(s): LIPASE, AMYLASE in the last 168 hours. No results for input(s): AMMONIA in the last 168 hours. Coagulation Profile: Recent Labs  Lab 08/11/2018 1701 08/11/18 0456  INR >10.00* >10.00*   Cardiac Enzymes: Recent Labs  Lab 08/24/2018 1610  TROPONINI <0.03   BNP (last 3 results) No results for input(s): PROBNP in the last 8760 hours. HbA1C: No results for input(s): HGBA1C in the last 72 hours. CBG: No results for input(s): GLUCAP in the last 168 hours. Lipid Profile: No results for input(s): CHOL, HDL, LDLCALC, TRIG, CHOLHDL, LDLDIRECT in the last 72 hours. Thyroid Function Tests: No results for input(s): TSH, T4TOTAL, FREET4, T3FREE, THYROIDAB in the last 72 hours. Anemia Panel: No results for input(s): VITAMINB12, FOLATE, FERRITIN, TIBC, IRON, RETICCTPCT in the last 72 hours. Sepsis Labs: No results for input(s): PROCALCITON, LATICACIDVEN in the last 168 hours.  No results found for this or any previous visit (from the past 240 hour(s)).        Radiology Studies: Dg Chest 2 View  Result Date: 08/27/2018 CLINICAL DATA:  Patient with worsening shortness of breath. EXAM: CHEST - 2 VIEW COMPARISON:  Chest radiograph 07/29/2018 FINDINGS: Monitoring leads overlie the patient. Stable cardiomegaly. Aortic atherosclerosis. Interval increase in size of large right pleural effusion with underlying consolidation. No pneumothorax. Left lung is clear. IMPRESSION: Interval increase in size of large effusion with underlying consolidation. Electronically Signed   By: Lovey Newcomer M.D.   On: 09/03/2018 16:42        Scheduled Meds: . diltiazem  180 mg Oral Daily  . docusate sodium  100 mg Oral BID  . feeding supplement (ENSURE ENLIVE)  237 mL Oral BID BM  . metoprolol succinate  50 mg Oral Daily   Continuous Infusions: . phytonadione (VITAMIN K) IV       LOS: 1 day    Time spent: 8mns    JKathie Dike MD [facility] Pager [phone] If 7PM-7AM, please contact night-coverage [email or web address] Password [facility]9/01/2018, 12:18 PM

## 2018-08-11 NOTE — Progress Notes (Signed)
Initial Nutrition Assessment  DOCUMENTATION CODES:   Severe malnutrition in context of acute illness/injury(Acute large PE -pt report "unable to eat since last Monday")  INTERVENTION:  Ensure Enlive po BID, each supplement provides 350 kcal and 20 grams of protein   Nutrition services staff will obtain food preferences daily    NUTRITION DIAGNOSIS:   Severe Malnutrition related to inability to eat, acute illness(Acute large PE) as evidenced by meal completion < 50%, percent weight loss, energy intake < or equal to 50% for > or equal to 5 days, moderate fat depletion, moderate muscle depletion.   GOAL:  Patient will meet greater than or equal to 90% of their needs   MONITOR:  PO intake, Weight trends, Labs    REASON FOR ASSESSMENT:   Malnutrition Screening Tool    ASSESSMENT:  Patient is a 78 yo male with hx of CKD, HTN, anemia, chronic anticoagulation and recurrent pulmonary embolism. Presents with a large right PE. Talked with pt nurse today. He was scheduled for thoracentesis to day but unable to complete due to his elevated INR.     Patient is complaining of poor appetite. His home diet is regular. Patient says he has not eaten since last Monday. He associates change in appetite with his increased shortness of breath. There is an Ensure here but untouched he says "I can't drink that". He has been drinking primarily water at home to prevent dehydration. Denies solid foods or nutrition type shakes. He is edentulous and has dentures but they are at home. Patient says prior to onset of acute illness he has an appetite of a horse.  Patient lives alone and his sister is primary source of support for food. He says she is a Field seismologist and frequently brings in food for him except leafy greens which he avoids.   His weight history: 84.4 kg (185.6 lb) currently which is a loss (6%) from 89.8 kg (197.5 lb)- 11 days ago. He has moderate loss of muscle and fat depletions.    Labs: BUN and  Cr. elevated BMP Latest Ref Rng & Units 08/11/2018 09/06/2018 07/29/2018  Glucose 70 - 99 mg/dL 903(E) 092(Z) 300(T)  BUN 8 - 23 mg/dL 62(U) 63(F) 20  Creatinine 0.61 - 1.24 mg/dL 3.54(T) 6.25(W) 3.89(H)  Sodium 135 - 145 mmol/L 141 141 140  Potassium 3.5 - 5.1 mmol/L 4.1 4.6 4.4  Chloride 98 - 111 mmol/L 105 105 108  CO2 22 - 32 mmol/L 26 24 26   Calcium 8.9 - 10.3 mg/dL ) 7.3(S) 2.8(J)     Medications reviewed and include: cardizem, colace, Ensure Enlive, Toprol  IVF- lactated ringers @ 75 ml/hr   NUTRITION - FOCUSED PHYSICAL EXAM:    Most Recent Value  Orbital Region  Moderate depletion  Upper Arm Region  Mild depletion  Thoracic and Lumbar Region  Moderate depletion  Buccal Region  No depletion  Temple Region  Moderate depletion  Clavicle Bone Region  Moderate depletion  Clavicle and Acromion Bone Region  Moderate depletion  Patellar Region  Unable to assess [patient had his jeans on]  Anterior Thigh Region  Unable to assess  Posterior Calf Region  Unable to assess  Edema (RD Assessment)  Moderate [left lower and mild right lower]  Hair  Reviewed  Eyes  Reviewed  Mouth  Reviewed  Skin  Reviewed [visibly dry and  flaky lower legs]  Nails  -- [long- unkept]       Diet Order:   Diet Order  Diet Heart Room service appropriate? Yes; Fluid consistency: Thin  Diet effective now              EDUCATION NEEDS:  Not appropriate for education at this time    Skin:  Skin Assessment: Reviewed RN Assessment  Last BM:  8/27   Height:   Ht Readings from Last 1 Encounters:  09/03/2018 5\' 11"  (1.803 m)    Weight:   Wt Readings from Last 1 Encounters:  08/21/2018 84.4 kg    Ideal Body Weight:  78 kg  BMI:  Body mass index is 25.94 kg/m.  Estimated Nutritional Needs:   Kcal:  2016-2268 (24-27 kcal/kg/bw)  Protein:  59-67 (0.7-0.8 gr/kg/bw)  Fluid:  2.0-2.2 liters daily    09-10-1971 MS,RD,CSG,LDN Office: [phone] Pager: [phone]

## 2018-08-11 NOTE — Progress Notes (Signed)
Patient lunch tray untouched in room. Patient states "I do not feel like eating". Patient encouraged to eat.

## 2018-08-11 NOTE — Progress Notes (Signed)
CRITICAL VALUE ALERT  Critical Value:  INR 11  Date & Time Notied:  08/11/2018 0730  Provider Notified: Memon  Orders Received/Actions taken: No orders given at this time. Will continue to monitor.

## 2018-08-11 NOTE — Progress Notes (Signed)
Patient found to have a MEWS of 4. Vital sign are stable and patient is on 2L of oxygen and is alert and oriented x4. Patient offers no complaints at this time. Will continue to monitor.

## 2018-08-12 ENCOUNTER — Inpatient Hospital Stay (HOSPITAL_COMMUNITY): Payer: Medicare HMO

## 2018-08-12 LAB — COMPREHENSIVE METABOLIC PANEL
ALBUMIN: 3 g/dL — AB (ref 3.5–5.0)
ALT: 6 U/L (ref 0–44)
ALT: 7 U/L (ref 0–44)
AST: 13 U/L — AB (ref 15–41)
AST: 15 U/L (ref 15–41)
Albumin: 2.9 g/dL — ABNORMAL LOW (ref 3.5–5.0)
Alkaline Phosphatase: 38 U/L (ref 38–126)
Alkaline Phosphatase: 45 U/L (ref 38–126)
Anion gap: 11 (ref 5–15)
Anion gap: 9 (ref 5–15)
BUN: 56 mg/dL — AB (ref 8–23)
BUN: 54 mg/dL — ABNORMAL HIGH (ref 8–23)
CALCIUM: 8.3 mg/dL — AB (ref 8.9–10.3)
CHLORIDE: 103 mmol/L (ref 98–111)
CO2: 24 mmol/L (ref 22–32)
CO2: 26 mmol/L (ref 22–32)
CREATININE: 1.73 mg/dL — AB (ref 0.61–1.24)
Calcium: 8.4 mg/dL — ABNORMAL LOW (ref 8.9–10.3)
Chloride: 101 mmol/L (ref 98–111)
Creatinine, Ser: 1.58 mg/dL — ABNORMAL HIGH (ref 0.61–1.24)
GFR calc Af Amer: 47 mL/min — ABNORMAL LOW (ref >60)
GFR, EST AFRICAN AMERICAN: 42 mL/min — AB (ref >60)
GFR, EST NON AFRICAN AMERICAN: 36 mL/min — AB (ref >60)
GFR, EST NON AFRICAN AMERICAN: 41 mL/min — AB (ref >60)
Glucose, Bld: 138 mg/dL — ABNORMAL HIGH (ref 70–99)
Glucose, Bld: 173 mg/dL — ABNORMAL HIGH (ref 70–99)
POTASSIUM: 4.8 mmol/L (ref 3.5–5.1)
POTASSIUM: 3.7 mmol/L (ref 3.5–5.1)
SODIUM: 138 mmol/L (ref 135–145)
SODIUM: 136 mmol/L (ref 135–145)
TOTAL PROTEIN: 6.7 g/dL (ref 6.5–8.1)
Total Bilirubin: 2.8 mg/dL — ABNORMAL HIGH (ref 0.3–1.2)
Total Bilirubin: 2.3 mg/dL — ABNORMAL HIGH (ref 0.3–1.2)
Total Protein: 6.4 g/dL — ABNORMAL LOW (ref 6.5–8.1)

## 2018-08-12 LAB — BODY FLUID CELL COUNT WITH DIFFERENTIAL
Eos, Fluid: 1 %
LYMPHS FL: 81 %
MONOCYTE-MACROPHAGE-SEROUS FLUID: 6 % — AB (ref 50–90)
Neutrophil Count, Fluid: 12 % (ref 0–25)
Total Nucleated Cell Count, Fluid: 2709 cu mm — ABNORMAL HIGH (ref 0–1000)

## 2018-08-12 LAB — CBC
HEMATOCRIT: 31.4 % — AB (ref 39.0–52.0)
HEMOGLOBIN: 9.4 g/dL — AB (ref 13.0–17.0)
MCH: 28.5 pg (ref 26.0–34.0)
MCHC: 29.9 g/dL — ABNORMAL LOW (ref 30.0–36.0)
MCV: 95.2 fL (ref 78.0–100.0)
Platelets: 417 10*3/uL — ABNORMAL HIGH (ref 150–400)
RBC: 3.3 MIL/uL — AB (ref 4.22–5.81)
RDW: 14.5 % (ref 11.5–15.5)
WBC: 6.8 10*3/uL (ref 4.0–10.5)

## 2018-08-12 LAB — PROTIME-INR
INR: 1.8
PROTHROMBIN TIME: 20.7 s — AB (ref 11.4–15.2)

## 2018-08-12 LAB — LACTATE DEHYDROGENASE: LDH: 148 U/L (ref 98–192)

## 2018-08-12 LAB — LACTATE DEHYDROGENASE, PLEURAL OR PERITONEAL FLUID: LD FL: 333 U/L — AB (ref 3–23)

## 2018-08-12 LAB — PROTEIN, PLEURAL OR PERITONEAL FLUID: Total protein, fluid: 5.2 g/dL

## 2018-08-12 LAB — GRAM STAIN

## 2018-08-12 LAB — GLUCOSE, PLEURAL OR PERITONEAL FLUID: Glucose, Fluid: 45 mg/dL

## 2018-08-12 MED ORDER — LACTATED RINGERS IV BOLUS
1000.0000 mL | Freq: Once | INTRAVENOUS | Status: AC
  Administered 2018-08-12: 1000 mL via INTRAVENOUS

## 2018-08-12 MED ORDER — LACTATED RINGERS IV SOLN
INTRAVENOUS | Status: DC
  Administered 2018-08-12 – 2018-08-13 (×2): via INTRAVENOUS

## 2018-08-12 NOTE — Procedures (Signed)
PreOperative Dx: RIGHT pleural effusion Postoperative Dx: RIGHT hemothorax Procedure:   US guided RIGHT thoracentesis Radiologist:  Tyron Russell Anesthesia:  10 ml of 1% lidocaine Specimen:  400 mL of dark old bloody pleural fluid EBL:   < 1 ml Complications: None

## 2018-08-12 NOTE — Progress Notes (Addendum)
PROGRESS NOTE    Allen Marshall  OVZ:[id number] DOB: 07-Nov-1941 DOA: 08/20/2018 PCP: Allen Squibb, MD   Brief Narrative:  77 year old male with Allen Marshall history of recurrent PE, paroxysmal Allen Marshall. fib, hypertension, admitted to the hospital with shortness of breath and found to have large right pleural effusion.  INR noted to be greater than 10.  Admitted for reversal of coagulopathy and thoracentesis.  Assessment & Plan:   Principal Problem:   Pleural effusion Active Problems:   Chronic anticoagulation   Atrial fibrillation (HCC)   Chronic pulmonary embolism (HCC)   Hypertension   Impaired fasting glucose   CKD (chronic kidney disease), stage III (HCC)   Supratherapeutic INR   Protein-calorie malnutrition, severe   1. Large right pleural effusion.  First noted on chest x-ray from 8/20.  There is possible underlying consolidation, but the patient does not have any significant infectious symptoms.  He does not have any fever, cough or leukocytosis. 1. Now s/p thoracentesis.  Based on thora (with 400 cc of dark old bloody pleural fluid), this may likely be Allen Marshall hemothorax in the setting of supratherapeutic INR. 2. Exuadative by light's.  Pending pH, fluid culture, cytology.  Gram stain with abundant WBC's, no organisms.  Will continue to hold off on abx. 3. CT surgery recommending transfer to [facility] for evaluation there  2. Chronic anticoagulation with supratherapeutic INR.  INR noted to be greater than 10 on admission.  Improved after multiple doses of vitamin K. 1. INR 1.8 today 2. Follow.  Will place consult for eliquis as this may be better option in the future, but continue to hold anticoagulation for now.  3. Anemia: 2/2 above, downtrending slowly, continue to monitor closely  4. Paroxysmal atrial fibrillation.  Initially presented with sinus rhythm.  He is now noted to be in atrial fibrillation, likely precipitated by respiratory compromise related to pleural effusion.   1. Continue diltiazem  180 mg daily as well as metop 50 mg daily.  BP's relatively soft, not much room to titrate.  HR in the 110's right now, continue to monitor.  5. History of recurrent PE.  Currently holding anticoagulation, but will look into eliquis as an alternative given supratherapeutic INR.    6. Hypertension.  Holding lisinopril based on renal function.  Continued on metoprolol and diltiazem, BP's relatively soft.  He complains of chronic LH today, will check orthostatics.  7. Acute Kidney Injury on Chronic kidney disease stage III.  Creatinine 1.73 today, baseline appears around 1.25.  Diuretics and ACE inhibitor currently on hold.  Will bolus and start IVF and follow closely.  Follow urinalysis.  8. Hyperbilirubinemia.  No complaints of abdominal pain.  Will check right upper quadrant ultrasound with sludge in gallbladder and loculated complex r pleural effusion.  Transaminases/alk phos are normal.   DVT prophylaxis: scds, holding warfarin Code Status: full Family Communication: none at bedside Disposition Plan: pending further evaluation   Consultants:   IR  Procedures:  9/3 PreOperative Dx:        RIGHT pleural effusion Postoperative Dx:       RIGHT hemothorax Procedure:                  US guided RIGHT thoracentesis Radiologist:                 Allen Marshall Anesthesia:                 10 ml of 1% lidocaine Specimen:  400 mL of dark old bloody pleural fluid EBL:                            < 1 ml Complications:            None   Antimicrobials:  Anti-infectives (From admission, onward)   None     Subjective: Feels ok after procedure. Feels LH.  This is chronic, he notes for years. Denies CP.  Feels SOB is improved.  Objective: Vitals:   08/12/18 0548 08/12/18 1100 08/12/18 1131 08/12/18 1342  BP: (!) 94/56 90/60 (!) 85/58 100/72  Pulse: 74   (!) 108  Resp: (!) 22 (!) 22 (!) 22 18  Temp: 97.7 F (36.5 C)   (!) 97.5 F (36.4 C)  TempSrc: Oral   Oral  SpO2: 98% 90%  92%   Weight:      Height:        Intake/Output Summary (Last 24 hours) at 08/12/2018 1645 Last data filed at 08/12/2018 1300 Gross per 24 hour  Intake 240 ml  Output 400 ml  Net -160 ml   Filed Weights   08/30/2018 1548  Weight: 84.4 kg    Examination:  General exam: Appears calm and comfortable  Respiratory system: Decreased breath sounds on the R. Cardiovascular system: irregularly irregular, mildly tachycardic Gastrointestinal system: Abdomen is nondistended, soft and nontender. Central nervous system: Alert and oriented. No focal neurological deficits. Extremities: no LEE Skin: No rashes, lesions or ulcers Psychiatry: Judgement and insight appear normal. Mood & affect appropriate.     Data Reviewed: I have personally reviewed following labs and imaging studies  CBC: Recent Labs  Lab 08/10/2018 1610 08/11/18 0456 08/12/18 0613  WBC 5.4 5.5 6.8  NEUTROABS 4.0  --   --   HGB 10.7* 9.8* 9.4*  HCT 35.2* 32.4* 31.4*  MCV 94.4 95.0 95.2  PLT 402* 413* 867*   Basic Metabolic Panel: Recent Labs  Lab 08/18/2018 1610 08/11/18 0456 08/12/18 0613  NA 141 141 138  K 4.6 4.1 4.8  CL 105 105 103  CO2 24 26 24   GLUCOSE 109* 104* 138*  BUN 50* 51* 56*  CREATININE 1.54* 1.61* 1.73*  CALCIUM 8.8* 8.7* 8.4*   GFR: Estimated Creatinine Clearance: 38.1 mL/min (Allen Marshall) (by C-G formula based on SCr of 1.73 mg/dL (H)). Liver Function Tests: Recent Labs  Lab 08/21/2018 1610 08/12/18 0613  AST 13* 13*  ALT 8 6  ALKPHOS 46 38  BILITOT 2.2* 2.8*  PROT 6.9 6.4*  ALBUMIN 3.2* 2.9*   No results for input(s): LIPASE, AMYLASE in the last 168 hours. No results for input(s): AMMONIA in the last 168 hours. Coagulation Profile: Recent Labs  Lab 09/03/2018 1701 08/11/18 0456 08/12/18 0613  INR >10.00* >10.00* 1.80   Cardiac Enzymes: Recent Labs  Lab 08/19/2018 1610  TROPONINI <0.03   BNP (last 3 results) No results for input(s): PROBNP in the last 8760 hours. HbA1C: No results  for input(s): HGBA1C in the last 72 hours. CBG: No results for input(s): GLUCAP in the last 168 hours. Lipid Profile: No results for input(s): CHOL, HDL, LDLCALC, TRIG, CHOLHDL, LDLDIRECT in the last 72 hours. Thyroid Function Tests: No results for input(s): TSH, T4TOTAL, FREET4, T3FREE, THYROIDAB in the last 72 hours. Anemia Panel: No results for input(s): VITAMINB12, FOLATE, FERRITIN, TIBC, IRON, RETICCTPCT in the last 72 hours. Sepsis Labs: No results for input(s): PROCALCITON, LATICACIDVEN in the last 168 hours.  Recent  Results (from the past 240 hour(s))  Gram stain     Status: None   Collection Time: 08/12/18 11:10 AM  Result Value Ref Range Status   Specimen Description FLUID RIGHT PLEURAL  Final   Special Requests NONE  Final   Gram Stain   Final    ABUNDANT WBC PRESENT,BOTH PMN AND MONONUCLEAR NO ORGANISMS SEEN Performed at [facility] Performed at [facility], [address], [place], [place] [address]    Report Status 08/12/2018 FINAL  Final         Radiology Studies: Dg Chest 1 View  Result Date: 08/12/2018 CLINICAL DATA:  RIGHT pleural effusion post thoracentesis EXAM: CHEST  1 VIEW COMPARISON:  08/19/2018 FINDINGS: Persistent large RIGHT pleural effusion and significant RIGHT lung atelectasis. No pneumothorax following thoracentesis. LEFT lung clear. Atherosclerotic calcification aorta. Heart size stable. IMPRESSION: No pneumothorax following RIGHT thoracentesis. Persistent large RIGHT pleural effusion and significant RIGHT lung atelectasis. Electronically Signed   By: Lavonia Dana M.D.   On: 08/12/2018 11:56   US Abdomen Limited Ruq  Result Date: 08/12/2018 CLINICAL DATA:  Hyperbilirubinemia EXAM: ULTRASOUND ABDOMEN LIMITED RIGHT UPPER QUADRANT COMPARISON:  None FINDINGS: Gallbladder: Significant sludge within gallbladder. No shadowing gallstones, gallbladder wall thickening or pericholecystic fluid. No sonographic Murphy sign. Common bile duct: Diameter: 7 mm  diameter, normal for age Liver: Normal appearance. No focal hepatic mass or nodularity. Portal vein is patent on color Doppler imaging with normal direction of blood flow towards the liver. Incidentally noted loculated RIGHT pleural effusion. IMPRESSION: Significant sludge within gallbladder. Loculated complex RIGHT pleural effusion. Electronically Signed   By: Lavonia Dana M.D.   On: 08/12/2018 11:57   US Thoracentesis Asp Pleural Space W/img Guide  Result Date: 08/12/2018 INDICATION: RIGHT pleural effusion, presented hospital with INR greater than 10 EXAM: ULTRASOUND GUIDED DIAGNOSTIC RIGHT THORACENTESIS MEDICATIONS: None. COMPLICATIONS: None immediate. PROCEDURE: Procedure, benefits, and risks of procedure were discussed with patient. Written informed consent for procedure was obtained. Time out protocol followed. Pleural effusion localized by ultrasound at the posterior RIGHT hemithorax. Visualized RIGHT pleural fluid is markedly complex, containing numerous loculations, septa, and low level internal echogenicity. Skin prepped and draped in usual sterile fashion. Skin and soft tissues anesthetized with 10 mL of 1% lidocaine. 8 French thoracentesis catheter placed into the RIGHT pleural space. 400 mL of dark old bloody fluid aspirated by syringe pump. Procedure tolerated well by patient without immediate complication. FINDINGS: Masato Pettie total of approximately 400 mL of hemorrhagic RIGHT pleural fluid was removed. Samples were sent to the laboratory as requested by the clinical team. IMPRESSION: Successful ultrasound guided RIGHT thoracentesis yielding 400 mL of dark old bloody pleural fluid / prior hemothorax. Electronically Signed   By: Lavonia Dana M.D.   On: 08/12/2018 12:02        Scheduled Meds: . diltiazem  180 mg Oral Daily  . docusate sodium  100 mg Oral BID  . feeding supplement (ENSURE ENLIVE)  237 mL Oral BID BM  . metoprolol succinate  50 mg Oral Daily   Continuous Infusions:   LOS: 2 days     Time spent: over 30 min MDM High with hemothorax, plan to transfer to [facility] for CT surgery eval    Fayrene Helper, MD [facility] Pager [phone]  If 7PM-7AM, please contact night-coverage [email or web address] Password [email or web address] 08/12/2018, 4:45 PM

## 2018-08-12 NOTE — Progress Notes (Signed)
Thoracentesis complete no signs of distress.  

## 2018-08-12 NOTE — Progress Notes (Signed)
Received pt from [facility] to [id number]. VSS. Tele applied, verified x2. CHG bath completed. Pt denies pain, c/o chronic dizziness (reports this has been an issue for around 5 years). Reports SOB w/ exertion. Updated pt on plan of care. Oriented to room and call light. Will continue to monitor.  Margarito Liner, RN

## 2018-08-13 DIAGNOSIS — E43 Unspecified severe protein-calorie malnutrition: Secondary | ICD-10-CM

## 2018-08-13 DIAGNOSIS — R791 Abnormal coagulation profile: Secondary | ICD-10-CM

## 2018-08-13 DIAGNOSIS — I2782 Chronic pulmonary embolism: Secondary | ICD-10-CM

## 2018-08-13 DIAGNOSIS — I480 Paroxysmal atrial fibrillation: Secondary | ICD-10-CM

## 2018-08-13 DIAGNOSIS — I10 Essential (primary) hypertension: Secondary | ICD-10-CM

## 2018-08-13 DIAGNOSIS — I4891 Unspecified atrial fibrillation: Secondary | ICD-10-CM

## 2018-08-13 DIAGNOSIS — N183 Chronic kidney disease, stage 3 (moderate): Secondary | ICD-10-CM

## 2018-08-13 DIAGNOSIS — J90 Pleural effusion, not elsewhere classified: Secondary | ICD-10-CM

## 2018-08-13 DIAGNOSIS — Z7901 Long term (current) use of anticoagulants: Secondary | ICD-10-CM

## 2018-08-13 DIAGNOSIS — R7301 Impaired fasting glucose: Secondary | ICD-10-CM

## 2018-08-13 LAB — URINALYSIS, ROUTINE W REFLEX MICROSCOPIC
BACTERIA UA: NONE SEEN
Bilirubin Urine: NEGATIVE
Glucose, UA: NEGATIVE mg/dL
Ketones, ur: NEGATIVE mg/dL
Leukocytes, UA: NEGATIVE
NITRITE: NEGATIVE
PROTEIN: NEGATIVE mg/dL
Specific Gravity, Urine: 1.018 (ref 1.005–1.030)
pH: 5 (ref 5.0–8.0)

## 2018-08-13 LAB — PH, BODY FLUID: pH, Body Fluid: 7.1

## 2018-08-13 LAB — CBC
HEMATOCRIT: 28.9 % — AB (ref 39.0–52.0)
HEMOGLOBIN: 8.7 g/dL — AB (ref 13.0–17.0)
MCH: 28.8 pg (ref 26.0–34.0)
MCHC: 30.1 g/dL (ref 30.0–36.0)
MCV: 95.7 fL (ref 78.0–100.0)
Platelets: 409 10*3/uL — ABNORMAL HIGH (ref 150–400)
RBC: 3.02 MIL/uL — AB (ref 4.22–5.81)
RDW: 14.2 % (ref 11.5–15.5)
WBC: 6.6 10*3/uL (ref 4.0–10.5)

## 2018-08-13 LAB — ABO/RH: ABO/RH(D): B POS

## 2018-08-13 LAB — HEPARIN LEVEL (UNFRACTIONATED): HEPARIN UNFRACTIONATED: 0.14 [IU]/mL — AB (ref 0.30–0.70)

## 2018-08-13 MED ORDER — HEPARIN (PORCINE) IN NACL 100-0.45 UNIT/ML-% IJ SOLN
1500.0000 [IU]/h | INTRAMUSCULAR | Status: DC
  Administered 2018-08-13: 1500 [IU]/h via INTRAVENOUS
  Filled 2018-08-13: qty 250

## 2018-08-13 MED ORDER — ALBUTEROL SULFATE (2.5 MG/3ML) 0.083% IN NEBU: 2.5000 mg | INHALATION_SOLUTION | RESPIRATORY_TRACT | Status: DC | PRN

## 2018-08-13 MED ORDER — HEPARIN (PORCINE) IN NACL 100-0.45 UNIT/ML-% IJ SOLN: 1300.0000 [IU]/h | INTRAMUSCULAR | Status: DC

## 2018-08-13 MED ORDER — HEPARIN (PORCINE) IN NACL 100-0.45 UNIT/ML-% IJ SOLN
1300.0000 [IU]/h | INTRAMUSCULAR | Status: DC
  Administered 2018-08-13: 1300 [IU]/h via INTRAVENOUS
  Filled 2018-08-13: qty 250

## 2018-08-13 MED ORDER — ALBUTEROL SULFATE (2.5 MG/3ML) 0.083% IN NEBU
2.5000 mg | INHALATION_SOLUTION | Freq: Four times a day (QID) | RESPIRATORY_TRACT | Status: DC | PRN
  Filled 2018-08-13: qty 3

## 2018-08-13 MED ORDER — IPRATROPIUM-ALBUTEROL 0.5-2.5 (3) MG/3ML IN SOLN
3.0000 mL | Freq: Two times a day (BID) | RESPIRATORY_TRACT | Status: DC
  Administered 2018-08-13 – 2018-08-14 (×3): 3 mL via RESPIRATORY_TRACT
  Filled 2018-08-13 (×3): qty 3

## 2018-08-13 MED ORDER — CEFUROXIME SODIUM 1.5 G IV SOLR
1.5000 g | INTRAVENOUS | Status: DC
  Filled 2018-08-13 (×2): qty 1.5

## 2018-08-13 NOTE — Progress Notes (Signed)
Report given to [facility] and transferred.

## 2018-08-13 NOTE — Progress Notes (Signed)
PROGRESS NOTE  Allen Marshall  XBL:[id number] DOB: 1941/03/25 DOA: 09/03/2018 PCP: Benita Stabile, MD   Brief Narrative: Allen Marshall is a 77 y.o. male with a history of recurrent PE, PAF on coumadin and HTN who presented to the ED at [facility] for worsening shortness of breath over the previous month associated with weakness, lightheadedness. Work up demonstrated supratherapeutic INR (>10) with right sided effusion which appeared bloody at time of thoracentesis 9/3 which yielded 400cc fluid. Vitamin K had been given and repeated with decline of INR to 1.80. He continued to be significantly symptomatic despite thoracentesis, so CT chest was performed showing highly loculated right-sided pleural effusion. He was transferred to [facility] for CT surgery evaluation. Case has been discussed with Dr. Donata Clay.   Assessment & Plan: Principal Problem:   Pleural effusion Active Problems:   Chronic anticoagulation   Atrial fibrillation (HCC)   Chronic pulmonary embolism (HCC)   Hypertension   Impaired fasting glucose   CKD (chronic kidney disease), stage III (HCC)   Supratherapeutic INR   Protein-calorie malnutrition, severe  Complex right pleural effusion with hemothorax: s/p 400cc bloody/exudative fluid on thoracentesis 9/3.  - Follow culture off abx, no organisms on gram stain. No fever or leukocytosis.  - Follow up cytology - Follow up CT surgery evaluation.   Dyspnea, acute hypoxic respiratory failure: Likely primarily due to above, also has ~ 40 years of smoking history (stopped ~15 years ago) so may be COPD as well.  - Trial duonebs prn as there is mild wheezing. Will hold steroids for now.  - Continue supplemental oxygen as needed  Aortic atherosclerosis and 3-vessel CAD: Noted on CT chest. No WMA's on echo 02/15/2017. Also EF 55% and indeterminate diastolic function w/AFib.  - Recommend ischemic evaluation as outpatient (or inpatient if develops anginal symptoms, but vehemently denies chest  pain).  History of recurrent PE: Maintained on coumadin for >10 years. Denies widely erratic INR's (checked monthly), but supratherapeutic here.  - INR 1.8. Will hold coumadin pending CT surgery evaluation. Agree with previous hospitalist that DOAC may be better option.  - With recurrent clots and ongoing AFib, will start heparin gtt  PAF: Brittle HR's over past 24 hours.  - Continue diltiazem and metoprolol po. Currently rate well controlled.  - Heparin as above  Normocytic anemia: Downtrending in setting of elevated INR and known hemothorax.  - Recheck CBC now. Transfusion threshold probably 8 w/CAD, possibly 9 with severe dyspnea.   HTN:  - Dilt, metop as above. Holding lisinopril.   AKI on stage III CKD: Baseline Cr ~1.25, elevated on admission.  - Hold diuretic, ACE inhibitor. Improved with IV fluids yesterday, will stop these for now but would need to restart if made NPO.   Hyperbilirubinemia: U/S showed sludge, no stones, wall thickening, pericholecystic fluid, negative Murphy. CBG 55mm, normal liver appearance, patent portal vein. LDH level is normal, not indicative of hemolysis.  - Monitor.   DVT prophylaxis: Heparin gtt Code Status: Full Family Communication: None at bedside Disposition Plan: Uncertain  Consultants:   Interventional radiology  Cardiothoracic surgery  Procedures:   Thoracentesis 9/3  Antimicrobials:  None   Subjective: Dyspnea remains severe with even minimal exertion (using a urinal in the bed) which is nowhere near his baseline.   Objective: Vitals:   08/12/18 2249 08/12/18 2300 08/13/18 0459 08/13/18 0834  BP:  111/86 103/79 116/62  Pulse:  (!) 104    Resp:  (!) 24 16 20   Temp:  97.7  F (36.5 C) 98 F (36.7 C) 97.7 F (36.5 C)  TempSrc:  Oral Oral Oral  SpO2:  91% 94% 93%  Weight: 79.4 kg     Height: 5\' 11"  (1.803 m)       Intake/Output Summary (Last 24 hours) at 08/13/2018 1014 Last data filed at 08/13/2018 0559 Gross per 24 hour   Intake 971.25 ml  Output 400 ml  Net 571.25 ml   Filed Weights   08/23/2018 1548 08/12/18 2249  Weight: 84.4 kg 79.4 kg    Gen: Elderly male in no distress  Pulm: Tachypneic on supplemental oxygen, increasing dyspnea with exam. Diminished throughout, worst in RLL and very scant end expiratory wheezing bilaterally.  CV: Irreg irreg, rate in 80's. No murmur, rub, or gallop. No JVD, no pedal edema. GI: Abdomen soft, non-tender, non-distended, with normoactive bowel sounds. No organomegaly or masses felt. Ext: Warm, no deformities Skin: No rashes, lesions no ulcers Neuro: Alert and oriented. No focal neurological deficits. Psych: Judgement and insight appear normal. Mood & affect appropriate.   Data Reviewed: I have personally reviewed following labs and imaging studies  CBC: Recent Labs  Lab 08/23/2018 1610 08/11/18 0456 08/12/18 0613 08/13/18 0752  WBC 5.4 5.5 6.8 6.6  NEUTROABS 4.0  --   --   --   HGB 10.7* 9.8* 9.4* 8.7*  HCT 35.2* 32.4* 31.4* 28.9*  MCV 94.4 95.0 95.2 95.7  PLT 402* 413* 417* 409*   Basic Metabolic Panel: Recent Labs  Lab 08/26/2018 1610 08/11/18 0456 08/12/18 0613 08/12/18 1722  NA 141 141 138 136  K 4.6 4.1 4.8 3.7  CL 105 105 103 101  CO2 24 26 24 26   GLUCOSE 109* 104* 138* 173*  BUN 50* 51* 56* 54*  CREATININE 1.54* 1.61* 1.73* 1.58*  CALCIUM 8.8* 8.7* 8.4* 8.3*   GFR: Estimated Creatinine Clearance: 41.7 mL/min (A) (by C-G formula based on SCr of 1.58 mg/dL (H)). Liver Function Tests: Recent Labs  Lab 08/21/2018 1610 08/12/18 0613 08/12/18 1722  AST 13* 13* 15  ALT 8 6 7   ALKPHOS 46 38 45  BILITOT 2.2* 2.8* 2.3*  PROT 6.9 6.4* 6.7  ALBUMIN 3.2* 2.9* 3.0*   No results for input(s): LIPASE, AMYLASE in the last 168 hours. No results for input(s): AMMONIA in the last 168 hours. Coagulation Profile: Recent Labs  Lab 09/03/2018 1701 08/11/18 0456 08/12/18 0613  INR >10.00* >10.00* 1.80   Cardiac Enzymes: Recent Labs  Lab  08/19/2018 1610  TROPONINI <0.03   BNP (last 3 results) No results for input(s): PROBNP in the last 8760 hours. HbA1C: No results for input(s): HGBA1C in the last 72 hours. CBG: No results for input(s): GLUCAP in the last 168 hours. Lipid Profile: No results for input(s): CHOL, HDL, LDLCALC, TRIG, CHOLHDL, LDLDIRECT in the last 72 hours. Thyroid Function Tests: No results for input(s): TSH, T4TOTAL, FREET4, T3FREE, THYROIDAB in the last 72 hours. Anemia Panel: No results for input(s): VITAMINB12, FOLATE, FERRITIN, TIBC, IRON, RETICCTPCT in the last 72 hours. Urine analysis:    Component Value Date/Time   COLORURINE AMBER (A) 08/13/2018 0604   APPEARANCEUR CLEAR 08/13/2018 0604   LABSPEC 1.018 08/13/2018 0604   PHURINE 5.0 08/13/2018 0604   GLUCOSEU NEGATIVE 08/13/2018 0604   HGBUR MODERATE (A) 08/13/2018 0604   BILIRUBINUR NEGATIVE 08/13/2018 0604   KETONESUR NEGATIVE 08/13/2018 0604   PROTEINUR NEGATIVE 08/13/2018 0604   NITRITE NEGATIVE 08/13/2018 0604   LEUKOCYTESUR NEGATIVE 08/13/2018 0604   Recent Results (from the  past 240 hour(s))  Culture, body fluid-bottle     Status: None (Preliminary result)   Collection Time: 08/12/18 11:10 AM  Result Value Ref Range Status   Specimen Description FLUID RIGHT PLEURAL COLLECTED BY DOCTOR  Final   Special Requests BOTTLES DRAWN AEROBIC AND ANAEROBIC 10CC EACH  Final   Culture   Final    NO GROWTH < 24 HOURS Performed at [facility], [address], [place], [place] [address]    Report Status PENDING  Incomplete  Gram stain     Status: None   Collection Time: 08/12/18 11:10 AM  Result Value Ref Range Status   Specimen Description FLUID RIGHT PLEURAL  Final   Special Requests NONE  Final   Gram Stain   Final    ABUNDANT WBC PRESENT,BOTH PMN AND MONONUCLEAR NO ORGANISMS SEEN Performed at [facility] Performed at [facility], [address] [place], [place] [address]    Report Status 08/12/2018 FINAL  Final       Radiology Studies: Dg Chest 1 View  Result Date: 08/12/2018 CLINICAL DATA:  RIGHT pleural effusion post thoracentesis EXAM: CHEST  1 VIEW COMPARISON:  08/13/2018 FINDINGS: Persistent large RIGHT pleural effusion and significant RIGHT lung atelectasis. No pneumothorax following thoracentesis. LEFT lung clear. Atherosclerotic calcification aorta. Heart size stable. IMPRESSION: No pneumothorax following RIGHT thoracentesis. Persistent large RIGHT pleural effusion and significant RIGHT lung atelectasis. Electronically Signed   By: Ulyses Southward M.D.   On: 08/12/2018 11:56   Ct Chest Wo Contrast  Result Date: 08/12/2018 CLINICAL DATA:  77 year old male with history of increasing shortness of breath after thoracentesis today. Pleural effusion. EXAM: CT CHEST WITHOUT CONTRAST TECHNIQUE: Multidetector CT imaging of the chest was performed following the standard protocol without IV contrast. COMPARISON:  Chest CT 04/14/2018. FINDINGS: Cardiovascular: Heart size is normal. There is no significant pericardial fluid, thickening or pericardial calcification. There is aortic atherosclerosis, as well as atherosclerosis of the great vessels of the mediastinum and the coronary arteries, including calcified atherosclerotic plaque in the left anterior descending, left circumflex and right coronary arteries. Mediastinum/Nodes: No pathologically enlarged mediastinal or hilar lymph nodes. Please note that accurate exclusion of hilar adenopathy is limited on noncontrast CT scans. Esophagus is unremarkable in appearance. No axillary lymphadenopathy. Lungs/Pleura: Large complex right pleural effusion with heterogeneous attenuation ranging from low to high attenuation, presumably reflective of internal blood products. Based on the complex appearance, this appears to be likely highly loculated. Extensive passive atelectasis throughout most of the right lung, with small amount of aerated right upper lobe. Small calcified pleural plaques  in the right hemithorax. No suspicious appearing pulmonary nodules or masses are noted in the left lung. No left pleural effusion. Upper Abdomen: Aortic atherosclerosis. Musculoskeletal: There are no aggressive appearing lytic or blastic lesions noted in the visualized portions of the skeleton. IMPRESSION: 1. Large complex right-sided pleural effusion which is likely highly loculated and contains either hemorrhagic or proteinaceous contents. 2. Aortic atherosclerosis, in addition to three-vessel coronary artery disease. Please note that although the presence of coronary artery calcium documents the presence of coronary artery disease, the severity of this disease and any potential stenosis cannot be assessed on this non-gated CT examination. Assessment for potential risk factor modification, dietary therapy or pharmacologic therapy may be warranted, if clinically indicated. 3. Additional incidental findings, as above. Aortic Atherosclerosis (ICD10-I70.0). Electronically Signed   By: Trudie Reed M.D.   On: 08/12/2018 20:54   US Abdomen Limited Ruq  Result Date: 08/12/2018 CLINICAL  DATA:  Hyperbilirubinemia EXAM: ULTRASOUND ABDOMEN LIMITED RIGHT UPPER QUADRANT COMPARISON:  None FINDINGS: Gallbladder: Significant sludge within gallbladder. No shadowing gallstones, gallbladder wall thickening or pericholecystic fluid. No sonographic Murphy sign. Common bile duct: Diameter: 7 mm diameter, normal for age Liver: Normal appearance. No focal hepatic mass or nodularity. Portal vein is patent on color Doppler imaging with normal direction of blood flow towards the liver. Incidentally noted loculated RIGHT pleural effusion. IMPRESSION: Significant sludge within gallbladder. Loculated complex RIGHT pleural effusion. Electronically Signed   By: Ulyses Southward M.D.   On: 08/12/2018 11:57   US Thoracentesis Asp Pleural Space W/img Guide  Result Date: 08/12/2018 INDICATION: RIGHT pleural effusion, presented hospital with INR  greater than 10 EXAM: ULTRASOUND GUIDED DIAGNOSTIC RIGHT THORACENTESIS MEDICATIONS: None. COMPLICATIONS: None immediate. PROCEDURE: Procedure, benefits, and risks of procedure were discussed with patient. Written informed consent for procedure was obtained. Time out protocol followed. Pleural effusion localized by ultrasound at the posterior RIGHT hemithorax. Visualized RIGHT pleural fluid is markedly complex, containing numerous loculations, septa, and low level internal echogenicity. Skin prepped and draped in usual sterile fashion. Skin and soft tissues anesthetized with 10 mL of 1% lidocaine. 8 French thoracentesis catheter placed into the RIGHT pleural space. 400 mL of dark old bloody fluid aspirated by syringe pump. Procedure tolerated well by patient without immediate complication. FINDINGS: A total of approximately 400 mL of hemorrhagic RIGHT pleural fluid was removed. Samples were sent to the laboratory as requested by the clinical team. IMPRESSION: Successful ultrasound guided RIGHT thoracentesis yielding 400 mL of dark old bloody pleural fluid / prior hemothorax. Electronically Signed   By: Ulyses Southward M.D.   On: 08/12/2018 12:02    Scheduled Meds: . diltiazem  180 mg Oral Daily  . docusate sodium  100 mg Oral BID  . feeding supplement (ENSURE ENLIVE)  237 mL Oral BID BM  . metoprolol succinate  50 mg Oral Daily   Continuous Infusions:    LOS: 3 days   Time spent: 35 minutes.  Allen Nine, MD [facility] [email or web address] Password [email or web address] 08/13/2018, 10:14 AM

## 2018-08-13 NOTE — Consult Note (Addendum)
[address]       [place] [address]             [phone]        REDA GETTIS [facility] Medical Record #[id number] Date of Birth: Dec 07, 1941  Referring: No ref. provider found Primary Care: Benita Stabile, MD Primary Cardiologist:No primary care provider on file.  Chief Complaint:    Chief Complaint  Patient presents with  . Shortness of Breath    History of Present Illness:     [name] is a 77 year old male patient with past medical history of atrial fibrillation, hypertension, chronic kidney disease, chronic pulmonary embolism, GERD, chronic low back pain, and previous tobacco abuse who presented to the emergency department with shortness of breath. He has been having on and off shortness of breath for years but this time he "felt like he was dying".  The shortness of breath worsens with exertion.  He denies cough, pain, and wheezing.  He was recently in the emergency department on 07/29/2018 for shortness of breath.  He was found to have a small to moderate right pleural effusion.  On chest x-ray this admission he was found to have a large right pleural effusion and underwent a thoracentesis on 08/12/2018 which yielded 400 mL of dark old bloody pleural fluid.  Post thoracentesis a chest x-ray was obtained which showed no pneumothorax.  A persistent large right pleural effusion and significant right lung atelectasis.  A CT scan of the chest was also obtained which showed a large complex right-sided pleural effusion which is likely highly loculated and contains either hemorrhagic or proteinaceous contents we are consulted for possible right video-assisted thoracoscopic surgery with thoracotomy, and drainage of a right pleural effusion.      Current Activity/ Functional Status: Patient was independent with mobility/ambulation, transfers, ADL's, IADL's.   Zubrod Score: At the time of surgery this patient's most appropriate activity status/level should be  described as: []     0    Normal activity, no symptoms [x]     1    Restricted in physical strenuous activity but ambulatory, able to do out light work []     2    Ambulatory and capable of self care, unable to do work activities, up and about                 more than 50%  Of the time                            []     3    Only limited self care, in bed greater than 50% of waking hours []     4    Completely disabled, no self care, confined to bed or chair []     5    Moribund  Past Medical History:  Diagnosis Date  . Allergic reaction 06/17/2012   Oral edema; possibly food related  . Anemia    05/2011: H&H-10.2/31.3, MCV-92  . Arthritis   . Atrial fibrillation (HCC) <2011   Arrhythmia of uncertain duration, but it least one year; additional records pending as of 03/2013  . Benign prostatic hypertrophy   . Chronic anticoagulation   . Chronic kidney disease    05/2011: BUN/Cr- 15/1.24, GFR-68  . Degenerative joint disease of knee    Bilateral; also hips  . Erectile dysfunction   . Essential hypertension   . Gastroesophageal reflux  Frequent pyrosis  . Insomnia    Difficulty falling asleep  . Leg edema, right Chronic   Status post venectomy 1990s  . Low back pain   . Pulmonary embolism (HCC) 2003, 2014   Recurrent pulmonary embolism with subtherapeutic INR in 12/2012  . Tobacco abuse, in remission    40 pack years; discontinued 2006; office spirometry in 2012: Nl ex FEF 25-75%    Past Surgical History:  Procedure Laterality Date  . CATARACT EXTRACTION W/ INTRAOCULAR LENS IMPLANT Bilateral   . COLONOSCOPY  2011   Negative screening study  . VARICOSE VEIN SURGERY  1970s  . VEIN SURGERY      Social History   Tobacco Use  Smoking Status Former Smoker  Smokeless Tobacco Never Used    Social History   Substance and Sexual Activity  Alcohol Use No   Comment: quit 8 yrs ago     Allergies  Allergen Reactions  . Mercury Hives    Current Facility-Administered Medications    Medication Dose Route Frequency Provider Last Rate Last Dose  . acetaminophen (TYLENOL) tablet 650 mg  650 mg Oral Q6H PRN Zigmund Daniel., MD       Or  . acetaminophen (TYLENOL) suppository 650 mg  650 mg Rectal Q6H PRN Zigmund Daniel., MD      . albuterol (PROVENTIL) (2.5 MG/3ML) 0.083% nebulizer solution 2.5 mg  2.5 mg Nebulization Q3H PRN Tyrone Nine, MD      . diltiazem (CARDIZEM CD) 24 hr capsule 180 mg  180 mg Oral Daily Zigmund Daniel., MD   180 mg at 08/13/18 0936  . docusate sodium (COLACE) capsule 100 mg  100 mg Oral BID Zigmund Daniel., MD   100 mg at 08/13/18 0936  . feeding supplement (ENSURE ENLIVE) (ENSURE ENLIVE) liquid 237 mL  237 mL Oral BID BM Zigmund Daniel., MD   237 mL at 08/13/18 0937  . heparin ADULT infusion 100 units/mL (25000 units/248mL sodium chloride 0.45%)  1,300 Units/hr Intravenous Continuous Almon Hercules, RPH 13 mL/hr at 08/13/18 1129 1,300 Units/hr at 08/13/18 1129  . ipratropium-albuterol (DUONEB) 0.5-2.5 (3) MG/3ML nebulizer solution 3 mL  3 mL Nebulization BID Tyrone Nine, MD   3 mL at 08/13/18 1108  . LORazepam (ATIVAN) tablet 0.5 mg  0.5 mg Oral BID PRN Zigmund Daniel., MD   0.5 mg at 11-Aug-2018 2158  . meclizine (ANTIVERT) tablet 12.5 mg  12.5 mg Oral TID PRN Zigmund Daniel., MD      . metoprolol succinate (TOPROL-XL) 24 hr tablet 50 mg  50 mg Oral Daily Zigmund Daniel., MD   50 mg at 08/13/18 8341  . ondansetron (ZOFRAN) tablet 4 mg  4 mg Oral Q6H PRN Zigmund Daniel., MD       Or  . ondansetron [facility]) injection 4 mg  4 mg Intravenous Q6H PRN Zigmund Daniel., MD      . traMADol Janean Sark) tablet 50 mg  50 mg Oral Q6H PRN Zigmund Daniel., MD   50 mg at 08/11/18 1845    Medications Prior to Admission  Medication Sig Dispense Refill Last Dose  . furosemide (LASIX) 20 MG tablet Take 1 tablet (20 mg total) by mouth daily as needed. (Patient taking differently: Take 20 mg by  mouth daily. ) 90 tablet 1 08/11/18 at Unknown time  . meclizine (ANTIVERT) 12.5 MG tablet Take 1 tablet (12.5  mg total) by mouth 2 (two) times daily as needed for dizziness. (Patient taking differently: Take 12.5 mg by mouth 3 (three) times daily as needed for dizziness. ) 20 tablet 0 Sep 06, 2018 at Unknown time  . warfarin (COUMADIN) 5 MG tablet Take 5-7.5 mg by mouth daily. 7.5mg  on Mondays and Thursdays. 5mg  on all other days   09/06/18 at 0920  . lisinopril (PRINIVIL,ZESTRIL) 2.5 MG tablet Take 2.5 mg by mouth at bedtime.   08/09/2018  . LORazepam (ATIVAN) 0.5 MG tablet Take 1 tablet (0.5 mg total) by mouth 2 (two) times daily as needed (dizziness). 15 tablet 0   . metoprolol succinate (TOPROL-XL) 50 MG 24 hr tablet Take 50 mg by mouth daily. Take with or immediately following a meal.    at 0900  . traMADol (ULTRAM) 50 MG tablet Take by mouth every 6 (six) hours as needed.   Past Week at Unknown time    Family History  Problem Relation Age of Onset  . Arthritis Mother   . Diabetes Sister   . Asthma Brother   . Atrial fibrillation Mother        Possible history of arrhythmia-patient recalls only palpitations  . Alzheimer's disease Father      Review of Systems:   ROS Pertinent items are noted in HPI.     Cardiac Review of Systems: Y or  [    ]= no  Chest Pain [  no  ]  Resting SOB [ yes  ] Exertional SOB  [ yes ]  Orthopnea [  ]   Pedal Edema [  yes ]    Palpitations [  ] Syncope  [  ]   Presyncope [   ]  General Review of Systems: [Y] = yes [  ]=no Constitional: recent weight change [  ]; anorexia [  ]; fatigue [  ]; nausea [  ]; night sweats [  ]; fever [ no ]; or chills [ no ]                                                               Dental: Last Dentist visit:   Eye : blurred vision [  ]; diplopia [   ]; vision changes [  ];  Amaurosis fugax[  ]; Resp: cough [no  ];  wheezing[ no ];  hemoptysis[  ]; shortness of breath[ yes  ]; paroxysmal nocturnal dyspnea[  ]; dyspnea on  exertion[ yes ]; or orthopnea[  ];  GI:  gallstones[  ], vomiting[  ];  dysphagia[  ]; melena[  ];  hematochezia [  ]; heartburn[  ];   Hx of  Colonoscopy[  ]; GU: kidney stones [  ]; hematuria[  ];   dysuria [  ];  nocturia[  ];  history of     obstruction [  ]; urinary frequency [  ]             Skin: rash, swelling[  ];, hair loss[  ];  peripheral edema[yes  ];  or itching[  ]; Musculosketetal: myalgias[  ];  joint swelling[  ];  joint erythema[  ];  joint pain[  ];  back pain[ yes];   Heme/Lymph: bruising[  ];  bleeding[ yes ];  anemia[  ];  Neuro:  TIA[  ];  headaches[  ];  stroke[  ];  vertigo[  ];  seizures[  ];   paresthesias[  ];  difficulty walking[  ];  Psych:depression[  ]; anxiety[  ];  Endocrine: diabetes[  ];  thyroid dysfunction[  ];              Physical Exam: BP 116/62 (BP Location: Right Arm)   Pulse (!) 104   Temp 97.7 F (36.5 C) (Oral)   Resp 20   Ht 5\' 11"  (1.803 m)   Wt 79.4 kg Comment: Scale A  SpO2 95%   BMI 24.41 kg/m    General appearance: alert, cooperative and no distress Resp: clear to auscultation bilaterally and diminished breath sounds in the right middle and lower lung fields Cardio: irregularly irregular rhythm GI: soft, non-tender; bowel sounds normal; no masses,  no organomegaly Extremities: extremities normal, atraumatic, no cyanosis or edema Neurologic: Grossly normal  Diagnostic Studies & Laboratory data:     Recent Radiology Findings:   Dg Chest 1 View  Result Date: 08/12/2018 CLINICAL DATA:  RIGHT pleural effusion post thoracentesis EXAM: CHEST  1 VIEW COMPARISON:  2018/08/18 FINDINGS: Persistent large RIGHT pleural effusion and significant RIGHT lung atelectasis. No pneumothorax following thoracentesis. LEFT lung clear. Atherosclerotic calcification aorta. Heart size stable. IMPRESSION: No pneumothorax following RIGHT thoracentesis. Persistent large RIGHT pleural effusion and significant RIGHT lung atelectasis. Electronically Signed    By: Ulyses Southward M.D.   On: 08/12/2018 11:56   Ct Chest Wo Contrast  Result Date: 08/12/2018 CLINICAL DATA:  77 year old male with history of increasing shortness of breath after thoracentesis today. Pleural effusion. EXAM: CT CHEST WITHOUT CONTRAST TECHNIQUE: Multidetector CT imaging of the chest was performed following the standard protocol without IV contrast. COMPARISON:  Chest CT 04/14/2018. FINDINGS: Cardiovascular: Heart size is normal. There is no significant pericardial fluid, thickening or pericardial calcification. There is aortic atherosclerosis, as well as atherosclerosis of the great vessels of the mediastinum and the coronary arteries, including calcified atherosclerotic plaque in the left anterior descending, left circumflex and right coronary arteries. Mediastinum/Nodes: No pathologically enlarged mediastinal or hilar lymph nodes. Please note that accurate exclusion of hilar adenopathy is limited on noncontrast CT scans. Esophagus is unremarkable in appearance. No axillary lymphadenopathy. Lungs/Pleura: Large complex right pleural effusion with heterogeneous attenuation ranging from low to high attenuation, presumably reflective of internal blood products. Based on the complex appearance, this appears to be likely highly loculated. Extensive passive atelectasis throughout most of the right lung, with small amount of aerated right upper lobe. Small calcified pleural plaques in the right hemithorax. No suspicious appearing pulmonary nodules or masses are noted in the left lung. No left pleural effusion. Upper Abdomen: Aortic atherosclerosis. Musculoskeletal: There are no aggressive appearing lytic or blastic lesions noted in the visualized portions of the skeleton. IMPRESSION: 1. Large complex right-sided pleural effusion which is likely highly loculated and contains either hemorrhagic or proteinaceous contents. 2. Aortic atherosclerosis, in addition to three-vessel coronary artery disease. Please  note that although the presence of coronary artery calcium documents the presence of coronary artery disease, the severity of this disease and any potential stenosis cannot be assessed on this non-gated CT examination. Assessment for potential risk factor modification, dietary therapy or pharmacologic therapy may be warranted, if clinically indicated. 3. Additional incidental findings, as above. Aortic Atherosclerosis (ICD10-I70.0). Electronically Signed   By: Trudie Reed M.D.   On: 08/12/2018 20:54   US Abdomen Limited Ruq  Result Date: 08/12/2018 CLINICAL  DATA:  Hyperbilirubinemia EXAM: ULTRASOUND ABDOMEN LIMITED RIGHT UPPER QUADRANT COMPARISON:  None FINDINGS: Gallbladder: Significant sludge within gallbladder. No shadowing gallstones, gallbladder wall thickening or pericholecystic fluid. No sonographic Murphy sign. Common bile duct: Diameter: 7 mm diameter, normal for age Liver: Normal appearance. No focal hepatic mass or nodularity. Portal vein is patent on color Doppler imaging with normal direction of blood flow towards the liver. Incidentally noted loculated RIGHT pleural effusion. IMPRESSION: Significant sludge within gallbladder. Loculated complex RIGHT pleural effusion. Electronically Signed   By: Ulyses Southward M.D.   On: 08/12/2018 11:57   US Thoracentesis Asp Pleural Space W/img Guide  Result Date: 08/12/2018 INDICATION: RIGHT pleural effusion, presented hospital with INR greater than 10 EXAM: ULTRASOUND GUIDED DIAGNOSTIC RIGHT THORACENTESIS MEDICATIONS: None. COMPLICATIONS: None immediate. PROCEDURE: Procedure, benefits, and risks of procedure were discussed with patient. Written informed consent for procedure was obtained. Time out protocol followed. Pleural effusion localized by ultrasound at the posterior RIGHT hemithorax. Visualized RIGHT pleural fluid is markedly complex, containing numerous loculations, septa, and low level internal echogenicity. Skin prepped and draped in usual sterile  fashion. Skin and soft tissues anesthetized with 10 mL of 1% lidocaine. 8 French thoracentesis catheter placed into the RIGHT pleural space. 400 mL of dark old bloody fluid aspirated by syringe pump. Procedure tolerated well by patient without immediate complication. FINDINGS: A total of approximately 400 mL of hemorrhagic RIGHT pleural fluid was removed. Samples were sent to the laboratory as requested by the clinical team. IMPRESSION: Successful ultrasound guided RIGHT thoracentesis yielding 400 mL of dark old bloody pleural fluid / prior hemothorax. Electronically Signed   By: Ulyses Southward M.D.   On: 08/12/2018 12:02     I have independently reviewed the above radiologic studies and discussed with the patient   Recent Lab Findings: Lab Results  Component Value Date   WBC 6.6 08/13/2018   HGB 8.7 (L) 08/13/2018   HCT 28.9 (L) 08/13/2018   PLT 409 (H) 08/13/2018   GLUCOSE 173 (H) 08/12/2018   ALT 7 08/12/2018   AST 15 08/12/2018   NA 136 08/12/2018   K 3.7 08/12/2018   CL 101 08/12/2018   CREATININE 1.58 (H) 08/12/2018   BUN 54 (H) 08/12/2018   CO2 26 08/12/2018   TSH 2.635 06/16/2012   INR 1.80 08/12/2018      Assessment / Plan:      1. Large right pleural effusion s/p right thoracentesis-the effusion is loculated.  We plan to do a VATS/thoracotomy incision with drainage of right pleural effusion tomorrow on 08/19/2018 to follow Dr. Sharee Pimple first case. 2.  Atrial fibrillation-currently the patient is taking Cardizem 180 mg daily, and metoprolol succinate 50 mg daily.  He is currently in rate controlled atrial fibrillation.  He has been on chronic anticoagulation with Coumadin.  His last INR was 1.8.  He was supratherapeutic with an INR greater than 10.  He was given SQ vitamin K in the ER. He is currently on Heparin gtt. He also has a history of chronic pulmonary embolism. 3.  Hypertension-holding lisinopril due to renal function.  Continue Toprol XL. 4.  CKD-is currently slightly  above baseline.  Holding diuretics and ACE inhibitor.  Continue to optimize renal function. Creatinine 1.58 today.  Plan: VATS/thoracotomy incision with drainage of right pleural effusion tomorrow on 08/31/2018 to follow Dr. Sharee Pimple first case. The procedure was explained in detail to the patient. No family at the bedside. All questions were answered to the patient's satisfaction.   I  spent 30 minutes counseling the patient face to face.   Jari Favre, PA-C  08/13/2018 1:38 PM  Medical record, CT scan reviewed and patient interviewed and examined. He has a large right hemothorax most likely due to markedly supra-therapeutic INR from Coumadin. This will require right thoracotomy for complete drainage. I discussed the operative procedure with the patient including alternatives, benefits and risks; including but not limited to bleeding, blood transfusion, infection, air leak from the lung, and recurrent effusion.   Alyce Pagan understands and agrees to proceed.  We will schedule surgery for tomorrow afternoon.

## 2018-08-13 NOTE — Consult Note (Signed)
ANTICOAGULATION CONSULT NOTE - Initial Consult  Pharmacy Consult for Heparin drip Indication: atrial fibrillation/history of recurrent PE  Allergies  Allergen Reactions  . Mercury Hives    Patient Measurements: Height: 5\' 11"  (180.3 cm) Weight: 175 lb (79.4 kg)(Scale A) IBW/kg (Calculated) : 75.3 Heparin Dosing Weight: 79.4  Vital Signs: Temp: 98 F (36.7 C) (09/04 1954) Temp Source: Oral (09/04 1954) BP: 100/72 (09/04 1645) Pulse Rate: 95 (09/04 1954)  Labs: Recent Labs    08/11/18 0456 08/12/18 10/12/18 08/12/18 1722 08/13/18 0752 08/13/18 1853  HGB 9.8* 9.4*  --  8.7*  --   HCT 32.4* 31.4*  --  28.9*  --   PLT 413* 417*  --  409*  --   LABPROT 85.3* 20.7*  --   --   --   INR >10.00* 1.80  --   --   --   HEPARINUNFRC  --   --   --   --  0.14*  CREATININE 1.61* 1.73* 1.58*  --   --     Estimated Creatinine Clearance: 41.7 mL/min (A) (by C-G formula based on SCr of 1.58 mg/dL (H)).   Medical History: Past Medical History:  Diagnosis Date  . Allergic reaction 06/17/2012   Oral edema; possibly food related  . Anemia    05/2011: H&H-10.2/31.3, MCV-92  . Arthritis   . Atrial fibrillation (HCC) <2011   Arrhythmia of uncertain duration, but it least one year; additional records pending as of 03/2013  . Benign prostatic hypertrophy   . Chronic anticoagulation   . Chronic kidney disease    05/2011: BUN/Cr- 15/1.24, GFR-68  . Degenerative joint disease of knee    Bilateral; also hips  . Erectile dysfunction   . Essential hypertension   . Gastroesophageal reflux    Frequent pyrosis  . Insomnia    Difficulty falling asleep  . Leg edema, right Chronic   Status post venectomy 1990s  . Low back pain   . Pulmonary embolism (HCC) 2003, 2014   Recurrent pulmonary embolism with subtherapeutic INR in 12/2012  . Tobacco abuse, in remission    40 pack years; discontinued 2006; office spirometry in 2012: Nl ex FEF 25-75%    Medications:  Scheduled:  . [START ON 2018/09/05]  cefUROXime  1.5 g Intravenous On Call to OR  . diltiazem  180 mg Oral Daily  . docusate sodium  100 mg Oral BID  . feeding supplement (ENSURE ENLIVE)  237 mL Oral BID BM  . ipratropium-albuterol  3 mL Nebulization BID  . metoprolol succinate  50 mg Oral Daily    Assessment: 77 y.o. Male was admitted on 9/1 for Pleural Effusion. PMH significant for recurrent PE, and PAF on Warfarin. INR was supratherapeutic on admission (>10), after Vitamin K administered (2x) INR was 1.8. Thoracentesis revealed blood in the pleural effusion - will not bolus. No other active bleeding in notes.    Heparin level low tonight. No bleeding issues noted. Note orders to stop heparin at 11am tomorrow prior to VATS.   Goal of Therapy:  Heparin level 0.3-0.7 units/ml Monitor platelets by anticoagulation protocol: Yes   Plan:  Increase heparin to 1500 Recheck heparin level in am Heparin off at 11am Continue to monitor H&H and platelets  F/u VATS scheduled for 9/5  11/5 PharmD., BCPS Clinical Pharmacist 08/13/2018 8:18 PM

## 2018-08-13 NOTE — Care Management (Signed)
08-13-18 BENEFITS CHECK:  #  1.  S/W  JENNIFER @ [employer] [facility] # [phone]   ELIQUIS  5 MG BID COVER- YES CO-PAY- $ 45.00 TIER- 3 DRUG PRIOR APPROVAL- NO  PREFERRED PHARMACY : YES [facility], [facility] AND [facility]

## 2018-08-13 NOTE — Progress Notes (Signed)
Report given to Stephanie

## 2018-08-13 NOTE — Consult Note (Signed)
ANTICOAGULATION CONSULT NOTE - Initial Consult  Pharmacy Consult for Heparin drip Indication: atrial fibrillation/history of recurrent PE  Allergies  Allergen Reactions  . Mercury Hives    Patient Measurements: Height: 5\' 11"  (180.3 cm) Weight: 175 lb (79.4 kg)(Scale A) IBW/kg (Calculated) : 75.3 Heparin Dosing Weight: 79.4  Vital Signs: Temp: 97.7 F (36.5 C) (09/04 0834) Temp Source: Oral (09/04 0834) BP: 116/62 (09/04 0834) Pulse Rate: 104 (09/03 2300)  Labs: Recent Labs    08/17/2018 1610 08/30/2018 1701 08/11/18 0456 08/12/18 0613 08/12/18 1722 08/13/18 0752  HGB 10.7*  --  9.8* 9.4*  --  8.7*  HCT 35.2*  --  32.4* 31.4*  --  28.9*  PLT 402*  --  413* 417*  --  409*  LABPROT  --  >90.0* 85.3* 20.7*  --   --   INR  --  >10.00* >10.00* 1.80  --   --   CREATININE 1.54*  --  1.61* 1.73* 1.58*  --   TROPONINI <0.03  --   --   --   --   --     Estimated Creatinine Clearance: 41.7 mL/min (A) (by C-G formula based on SCr of 1.58 mg/dL (H)).   Medical History: Past Medical History:  Diagnosis Date  . Allergic reaction 06/17/2012   Oral edema; possibly food related  . Anemia    05/2011: H&H-10.2/31.3, MCV-92  . Arthritis   . Atrial fibrillation (HCC) <2011   Arrhythmia of uncertain duration, but it least one year; additional records pending as of 03/2013  . Benign prostatic hypertrophy   . Chronic anticoagulation   . Chronic kidney disease    05/2011: BUN/Cr- 15/1.24, GFR-68  . Degenerative joint disease of knee    Bilateral; also hips  . Erectile dysfunction   . Essential hypertension   . Gastroesophageal reflux    Frequent pyrosis  . Insomnia    Difficulty falling asleep  . Leg edema, right Chronic   Status post venectomy 1990s  . Low back pain   . Pulmonary embolism (HCC) 2003, 2014   Recurrent pulmonary embolism with subtherapeutic INR in 12/2012  . Tobacco abuse, in remission    40 pack years; discontinued 2006; office spirometry in 2012: Nl ex FEF 25-75%     Medications:  Scheduled:  . diltiazem  180 mg Oral Daily  . docusate sodium  100 mg Oral BID  . feeding supplement (ENSURE ENLIVE)  237 mL Oral BID BM  . ipratropium-albuterol  3 mL Nebulization BID  . metoprolol succinate  50 mg Oral Daily    Assessment: 77 y.o. Male was admitted on 9/1 for Pleural Effusion. PMH significant for recurrent PE, and PAF on Warfarin. INR was supratherapeutic on admission (>10), after Vitamin K administered (2x) INR was 1.8. Thoracentesis revealed blood in the pleural effusion - will not bolus. No other active bleeding in notes.    Hgb is low and has been steadily decreasing (8.7 today, 10.7 on 9/1), plt are slightly elevated at 409, INR is subtherapeutic at 1.8 (9/3).    Goal of Therapy:  Heparin level 0.3-0.7 units/ml Monitor platelets by anticoagulation protocol: Yes   Plan:  Start heparin infusion at 1300 units/hr Check anti-Xa level in 6 hours and daily while on heparin Continue to monitor H&H and platelets  F/u VATS scheduled for 9/5  11/5, PharmD Student 3rd Year Pharmacy Student  08/13/2018,10:50 AM

## 2018-08-14 ENCOUNTER — Encounter (HOSPITAL_COMMUNITY): Payer: Self-pay | Admitting: Surgery

## 2018-08-14 ENCOUNTER — Inpatient Hospital Stay (HOSPITAL_COMMUNITY): Payer: Medicare HMO | Admitting: Certified Registered"

## 2018-08-14 ENCOUNTER — Encounter (HOSPITAL_COMMUNITY): Admission: EM | Disposition: E | Payer: Self-pay | Source: Home / Self Care | Attending: Family Medicine

## 2018-08-14 ENCOUNTER — Inpatient Hospital Stay (HOSPITAL_COMMUNITY): Payer: Medicare HMO

## 2018-08-14 DIAGNOSIS — Z9889 Other specified postprocedural states: Secondary | ICD-10-CM

## 2018-08-14 DIAGNOSIS — J90 Pleural effusion, not elsewhere classified: Secondary | ICD-10-CM

## 2018-08-14 HISTORY — PX: PLEURAL EFFUSION DRAINAGE: SHX5099

## 2018-08-14 HISTORY — PX: DECORTICATION: SHX5101

## 2018-08-14 HISTORY — PX: VIDEO ASSISTED THORACOSCOPY (VATS)/THOROCOTOMY: SHX6173

## 2018-08-14 LAB — POCT I-STAT 7, (LYTES, BLD GAS, ICA,H+H)
Acid-base deficit: 3 mmol/L — ABNORMAL HIGH (ref 0.0–2.0)
Acid-base deficit: 4 mmol/L — ABNORMAL HIGH (ref 0.0–2.0)
BICARBONATE: 27.7 mmol/L (ref 20.0–28.0)
Bicarbonate: 22.3 mmol/L (ref 20.0–28.0)
Bicarbonate: 22.7 mmol/L (ref 20.0–28.0)
CALCIUM ION: 1.45 mmol/L — AB (ref 1.15–1.40)
Calcium, Ion: 1.3 mmol/L (ref 1.15–1.40)
Calcium, Ion: 1.21 mmol/L (ref 1.15–1.40)
HCT: 26 % — ABNORMAL LOW (ref 39.0–52.0)
HCT: 30 % — ABNORMAL LOW (ref 39.0–52.0)
HEMATOCRIT: 21 % — AB (ref 39.0–52.0)
HEMOGLOBIN: 8.8 g/dL — AB (ref 13.0–17.0)
HEMOGLOBIN: 10.2 g/dL — AB (ref 13.0–17.0)
Hemoglobin: 7.1 g/dL — ABNORMAL LOW (ref 13.0–17.0)
O2 SAT: 92 %
O2 SAT: 88 %
O2 Saturation: 98 %
PCO2 ART: 40.5 mmHg (ref 32.0–48.0)
PCO2 ART: 46 mmHg (ref 32.0–48.0)
PO2 ART: 128 mmHg — AB (ref 83.0–108.0)
POTASSIUM: 3.6 mmol/L (ref 3.5–5.1)
Potassium: 3.9 mmol/L (ref 3.5–5.1)
Potassium: 4.4 mmol/L (ref 3.5–5.1)
SODIUM: 143 mmol/L (ref 135–145)
Sodium: 140 mmol/L (ref 135–145)
Sodium: 141 mmol/L (ref 135–145)
TCO2: 23 mmol/L (ref 22–32)
TCO2: 24 mmol/L (ref 22–32)
TCO2: 29 mmol/L (ref 22–32)
pCO2 arterial: 59.6 mmHg — ABNORMAL HIGH (ref 32.0–48.0)
pH, Arterial: 7.348 — ABNORMAL LOW (ref 7.350–7.450)
pH, Arterial: 7.302 — ABNORMAL LOW (ref 7.350–7.450)
pH, Arterial: 7.275 — ABNORMAL LOW (ref 7.350–7.450)
pO2, Arterial: 67 mmHg — ABNORMAL LOW (ref 83.0–108.0)
pO2, Arterial: 61 mmHg — ABNORMAL LOW (ref 83.0–108.0)

## 2018-08-14 LAB — BASIC METABOLIC PANEL
Anion gap: 10 (ref 5–15)
BUN: 38 mg/dL — AB (ref 8–23)
CO2: 23 mmol/L (ref 22–32)
Calcium: 8.3 mg/dL — ABNORMAL LOW (ref 8.9–10.3)
Chloride: 107 mmol/L (ref 98–111)
Creatinine, Ser: 1.32 mg/dL — ABNORMAL HIGH (ref 0.61–1.24)
GFR calc Af Amer: 58 mL/min — ABNORMAL LOW (ref >60)
GFR calc non Af Amer: 50 mL/min — ABNORMAL LOW (ref >60)
Glucose, Bld: 131 mg/dL — ABNORMAL HIGH (ref 70–99)
POTASSIUM: 3.9 mmol/L (ref 3.5–5.1)
SODIUM: 140 mmol/L (ref 135–145)

## 2018-08-14 LAB — GLUCOSE, CAPILLARY
Glucose-Capillary: 117 mg/dL — ABNORMAL HIGH (ref 70–99)
Glucose-Capillary: 139 mg/dL — ABNORMAL HIGH (ref 70–99)

## 2018-08-14 LAB — CBC
HCT: 31.7 % — ABNORMAL LOW (ref 39.0–52.0)
HEMATOCRIT: 31.5 % — AB (ref 39.0–52.0)
HEMOGLOBIN: 9.7 g/dL — AB (ref 13.0–17.0)
Hemoglobin: 9.6 g/dL — ABNORMAL LOW (ref 13.0–17.0)
MCH: 29 pg (ref 26.0–34.0)
MCH: 28.8 pg (ref 26.0–34.0)
MCHC: 30.5 g/dL (ref 30.0–36.0)
MCHC: 30.6 g/dL (ref 30.0–36.0)
MCV: 95.2 fL (ref 78.0–100.0)
MCV: 94.1 fL (ref 78.0–100.0)
PLATELETS: 339 10*3/uL (ref 150–400)
Platelets: 347 10*3/uL (ref 150–400)
RBC: 3.31 MIL/uL — ABNORMAL LOW (ref 4.22–5.81)
RBC: 3.37 MIL/uL — ABNORMAL LOW (ref 4.22–5.81)
RDW: 14.1 % (ref 11.5–15.5)
RDW: 15.2 % (ref 11.5–15.5)
WBC: 7.7 10*3/uL (ref 4.0–10.5)
WBC: 7.7 10*3/uL (ref 4.0–10.5)

## 2018-08-14 LAB — POCT I-STAT 4, (NA,K, GLUC, HGB,HCT)
Glucose, Bld: 157 mg/dL — ABNORMAL HIGH (ref 70–99)
HCT: 27 % — ABNORMAL LOW (ref 39.0–52.0)
HEMOGLOBIN: 9.2 g/dL — AB (ref 13.0–17.0)
POTASSIUM: 3.7 mmol/L (ref 3.5–5.1)
SODIUM: 140 mmol/L (ref 135–145)

## 2018-08-14 LAB — POCT I-STAT 3, ART BLOOD GAS (G3+)
Acid-base deficit: 1 mmol/L (ref 0.0–2.0)
BICARBONATE: 25.1 mmol/L (ref 20.0–28.0)
O2 SAT: 96 %
TCO2: 26 mmol/L (ref 22–32)
pCO2 arterial: 41.9 mmHg (ref 32.0–48.0)
pH, Arterial: 7.379 (ref 7.350–7.450)
pO2, Arterial: 78 mmHg — ABNORMAL LOW (ref 83.0–108.0)

## 2018-08-14 LAB — HEPARIN LEVEL (UNFRACTIONATED): Heparin Unfractionated: 0.34 IU/mL (ref 0.30–0.70)

## 2018-08-14 LAB — PREPARE RBC (CROSSMATCH)

## 2018-08-14 LAB — MRSA PCR SCREENING: MRSA by PCR: POSITIVE — AB

## 2018-08-14 SURGERY — VIDEO ASSISTED THORACOSCOPY (VATS)/THOROCOTOMY
Anesthesia: General | Site: Chest | Laterality: Right

## 2018-08-14 MED ORDER — DEXMEDETOMIDINE HCL IN NACL 400 MCG/100ML IV SOLN
0.0000 ug/kg/h | INTRAVENOUS | Status: DC
  Administered 2018-08-14: 1 ug/kg/h via INTRAVENOUS
  Administered 2018-08-15: 0.3 ug/kg/h via INTRAVENOUS
  Filled 2018-08-14 (×3): qty 100

## 2018-08-14 MED ORDER — SENNOSIDES-DOCUSATE SODIUM 8.6-50 MG PO TABS
1.0000 | ORAL_TABLET | Freq: Every day | ORAL | Status: DC
  Administered 2018-08-14: 1 via ORAL
  Filled 2018-08-14: qty 1

## 2018-08-14 MED ORDER — PROPOFOL 10 MG/ML IV BOLUS
INTRAVENOUS | Status: DC | PRN
  Administered 2018-08-14: 50 mg via INTRAVENOUS
  Administered 2018-08-14: 70 mg via INTRAVENOUS
  Administered 2018-08-14: 50 mg via INTRAVENOUS

## 2018-08-14 MED ORDER — MIDAZOLAM HCL 2 MG/2ML IJ SOLN
INTRAMUSCULAR | Status: AC
  Filled 2018-08-14: qty 2

## 2018-08-14 MED ORDER — FENTANYL CITRATE (PF) 100 MCG/2ML IJ SOLN
INTRAMUSCULAR | Status: DC | PRN
  Administered 2018-08-14 (×2): 100 ug via INTRAVENOUS
  Administered 2018-08-14: 50 ug via INTRAVENOUS

## 2018-08-14 MED ORDER — ACETAMINOPHEN 160 MG/5ML PO SOLN
1000.0000 mg | Freq: Four times a day (QID) | ORAL | Status: DC
  Administered 2018-08-15 (×2): 1000 mg via ORAL
  Filled 2018-08-14 (×2): qty 40.6

## 2018-08-14 MED ORDER — SODIUM CHLORIDE 0.9 % IV SOLN
INTRAVENOUS | Status: DC | PRN
  Administered 2018-08-14: 100 ug/min via INTRAVENOUS

## 2018-08-14 MED ORDER — DIGOXIN 0.25 MG/ML IJ SOLN: 0.2500 mg | Freq: Four times a day (QID) | INTRAMUSCULAR | Status: DC

## 2018-08-14 MED ORDER — FENTANYL CITRATE (PF) 250 MCG/5ML IJ SOLN
INTRAMUSCULAR | Status: AC
  Filled 2018-08-14: qty 5

## 2018-08-14 MED ORDER — ORAL CARE MOUTH RINSE
15.0000 mL | OROMUCOSAL | Status: DC
  Administered 2018-08-14 – 2018-08-15 (×5): 15 mL via OROMUCOSAL

## 2018-08-14 MED ORDER — LACTATED RINGERS IV SOLN: INTRAVENOUS | Status: DC

## 2018-08-14 MED ORDER — LEVALBUTEROL HCL 0.63 MG/3ML IN NEBU
INHALATION_SOLUTION | RESPIRATORY_TRACT | Status: AC
  Administered 2018-08-14: 0.63 mg via RESPIRATORY_TRACT
  Filled 2018-08-14: qty 3

## 2018-08-14 MED ORDER — SUCCINYLCHOLINE CHLORIDE 20 MG/ML IJ SOLN
INTRAMUSCULAR | Status: DC | PRN
  Administered 2018-08-14 (×2): 100 mg via INTRAVENOUS

## 2018-08-14 MED ORDER — ONDANSETRON HCL 4 MG/2ML IJ SOLN
INTRAMUSCULAR | Status: DC | PRN
  Administered 2018-08-14: 4 mg via INTRAVENOUS

## 2018-08-14 MED ORDER — DILTIAZEM HCL-DEXTROSE 100-5 MG/100ML-% IV SOLN (PREMIX)
5.0000 mg/h | INTRAVENOUS | Status: DC
  Administered 2018-08-14: .5 mg via INTRAVENOUS
  Filled 2018-08-14: qty 100

## 2018-08-14 MED ORDER — ONDANSETRON HCL 4 MG/2ML IJ SOLN: 4.0000 mg | Freq: Four times a day (QID) | INTRAMUSCULAR | Status: DC | PRN

## 2018-08-14 MED ORDER — LIDOCAINE 2% (20 MG/ML) 5 ML SYRINGE
INTRAMUSCULAR | Status: AC
  Filled 2018-08-14: qty 10

## 2018-08-14 MED ORDER — ACETAMINOPHEN 500 MG PO TABS
1000.0000 mg | ORAL_TABLET | Freq: Four times a day (QID) | ORAL | Status: DC
  Administered 2018-08-15: 1000 mg via ORAL
  Filled 2018-08-14: qty 2

## 2018-08-14 MED ORDER — MIDAZOLAM HCL 5 MG/5ML IJ SOLN
INTRAMUSCULAR | Status: DC | PRN
  Administered 2018-08-14: 2 mg via INTRAVENOUS

## 2018-08-14 MED ORDER — TRAMADOL HCL 50 MG PO TABS: 50.0000 mg | ORAL_TABLET | Freq: Four times a day (QID) | ORAL | Status: DC | PRN

## 2018-08-14 MED ORDER — SODIUM CHLORIDE 0.9% FLUSH
10.0000 mL | Freq: Two times a day (BID) | INTRAVENOUS | Status: DC
  Administered 2018-08-14 – 2018-08-15 (×2): 10 mL

## 2018-08-14 MED ORDER — MORPHINE SULFATE (PF) 2 MG/ML IV SOLN: 2.0000 mg | INTRAVENOUS | Status: DC | PRN

## 2018-08-14 MED ORDER — ESMOLOL HCL 100 MG/10ML IV SOLN
INTRAVENOUS | Status: DC | PRN
  Administered 2018-08-14: 50 mg via INTRAVENOUS

## 2018-08-14 MED ORDER — LEVALBUTEROL HCL 0.63 MG/3ML IN NEBU
0.6300 mg | INHALATION_SOLUTION | Freq: Four times a day (QID) | RESPIRATORY_TRACT | Status: DC
  Administered 2018-08-14 – 2018-08-15 (×4): 0.63 mg via RESPIRATORY_TRACT
  Filled 2018-08-14 (×4): qty 3

## 2018-08-14 MED ORDER — POTASSIUM CHLORIDE 10 MEQ/50ML IV SOLN: 10.0000 meq | Freq: Every day | INTRAVENOUS | Status: DC | PRN

## 2018-08-14 MED ORDER — PROPOFOL 500 MG/50ML IV EMUL
INTRAVENOUS | Status: DC | PRN
  Administered 2018-08-14: 60 ug/kg/min via INTRAVENOUS

## 2018-08-14 MED ORDER — PROPOFOL 10 MG/ML IV BOLUS
INTRAVENOUS | Status: AC
  Filled 2018-08-14: qty 20

## 2018-08-14 MED ORDER — PHENYLEPHRINE HCL-NACL 10-0.9 MG/250ML-% IV SOLN
0.0000 ug/min | INTRAVENOUS | Status: DC
  Administered 2018-08-14: 40 ug/min via INTRAVENOUS
  Administered 2018-08-15: 25 ug/min via INTRAVENOUS
  Administered 2018-08-15: 30 ug/min via INTRAVENOUS
  Filled 2018-08-14 (×4): qty 250

## 2018-08-14 MED ORDER — METOCLOPRAMIDE HCL 5 MG/ML IJ SOLN
10.0000 mg | Freq: Four times a day (QID) | INTRAMUSCULAR | Status: DC
  Administered 2018-08-14 – 2018-08-15 (×5): 10 mg via INTRAVENOUS
  Filled 2018-08-14 (×5): qty 2

## 2018-08-14 MED ORDER — FENTANYL CITRATE (PF) 100 MCG/2ML IJ SOLN: 25.0000 ug | INTRAMUSCULAR | Status: DC | PRN

## 2018-08-14 MED ORDER — CALCIUM CHLORIDE 10 % IV SOLN
INTRAVENOUS | Status: AC
  Filled 2018-08-14: qty 10

## 2018-08-14 MED ORDER — SUGAMMADEX SODIUM 200 MG/2ML IV SOLN
INTRAVENOUS | Status: DC | PRN
  Administered 2018-08-14: 200 mg via INTRAVENOUS
  Administered 2018-08-14: 50 mg via INTRAVENOUS

## 2018-08-14 MED ORDER — DEXTROSE-NACL 5-0.9 % IV SOLN
INTRAVENOUS | Status: DC
  Administered 2018-08-14 – 2018-08-15 (×3): via INTRAVENOUS

## 2018-08-14 MED ORDER — LACTATED RINGERS IV SOLN
INTRAVENOUS | Status: DC | PRN
  Administered 2018-08-14: 13:00:00 via INTRAVENOUS

## 2018-08-14 MED ORDER — INSULIN ASPART 100 UNIT/ML ~~LOC~~ SOLN
0.0000 [IU] | SUBCUTANEOUS | Status: DC
  Administered 2018-08-14: 2 [IU] via SUBCUTANEOUS
  Administered 2018-08-15: 4 [IU] via SUBCUTANEOUS
  Administered 2018-08-15 (×2): 2 [IU] via SUBCUTANEOUS
  Administered 2018-08-15: 8 [IU] via SUBCUTANEOUS
  Administered 2018-08-15: 2 [IU] via SUBCUTANEOUS
  Administered 2018-08-15: 4 [IU] via SUBCUTANEOUS

## 2018-08-14 MED ORDER — NOREPINEPHRINE BITARTRATE 1 MG/ML IV SOLN
INTRAVENOUS | Status: DC | PRN
  Administered 2018-08-14: 1 ug/min via INTRAVENOUS

## 2018-08-14 MED ORDER — DIGOXIN 0.25 MG/ML IJ SOLN
0.2000 mg | INTRAMUSCULAR | Status: DC
  Filled 2018-08-14: qty 1

## 2018-08-14 MED ORDER — EPINEPHRINE PF 1 MG/10ML IJ SOSY
PREFILLED_SYRINGE | INTRAMUSCULAR | Status: DC | PRN
  Administered 2018-08-14: .15 mg via INTRAVENOUS

## 2018-08-14 MED ORDER — SODIUM CHLORIDE 0.9 % IV SOLN
1.5000 g | Freq: Once | INTRAVENOUS | Status: AC
  Administered 2018-08-14: 1.5 g via INTRAVENOUS
  Filled 2018-08-14: qty 1.5

## 2018-08-14 MED ORDER — SUCCINYLCHOLINE CHLORIDE 200 MG/10ML IV SOSY
PREFILLED_SYRINGE | INTRAVENOUS | Status: AC
  Filled 2018-08-14: qty 20

## 2018-08-14 MED ORDER — CHLORHEXIDINE GLUCONATE CLOTH 2 % EX PADS: 6.0000 | MEDICATED_PAD | Freq: Every day | CUTANEOUS | Status: DC

## 2018-08-14 MED ORDER — ALBUMIN HUMAN 5 % IV SOLN
INTRAVENOUS | Status: DC | PRN
  Administered 2018-08-14 (×3): via INTRAVENOUS

## 2018-08-14 MED ORDER — LACTATED RINGERS IV SOLN
INTRAVENOUS | Status: DC
  Administered 2018-08-14: 13:00:00 via INTRAVENOUS

## 2018-08-14 MED ORDER — CALCIUM CHLORIDE 10 % IV SOLN
INTRAVENOUS | Status: DC | PRN
  Administered 2018-08-14: 500 mg via INTRAVENOUS

## 2018-08-14 MED ORDER — 0.9 % SODIUM CHLORIDE (POUR BTL) OPTIME
TOPICAL | Status: DC | PRN
  Administered 2018-08-14: 2000 mL

## 2018-08-14 MED ORDER — ONDANSETRON HCL 4 MG/2ML IJ SOLN
INTRAMUSCULAR | Status: AC
  Filled 2018-08-14: qty 2

## 2018-08-14 MED ORDER — SODIUM CHLORIDE 0.9 % IV SOLN: 10.0000 mL/h | Freq: Once | INTRAVENOUS | Status: DC

## 2018-08-14 MED ORDER — ROCURONIUM BROMIDE 10 MG/ML (PF) SYRINGE
PREFILLED_SYRINGE | INTRAVENOUS | Status: DC | PRN
  Administered 2018-08-14: 30 mg via INTRAVENOUS
  Administered 2018-08-14: 20 mg via INTRAVENOUS

## 2018-08-14 MED ORDER — OXYCODONE HCL 5 MG PO TABS
5.0000 mg | ORAL_TABLET | ORAL | Status: DC | PRN
  Administered 2018-08-15: 10 mg via ORAL
  Filled 2018-08-14: qty 2

## 2018-08-14 MED ORDER — VASOPRESSIN 20 UNIT/ML IV SOLN
INTRAVENOUS | Status: AC
  Filled 2018-08-14: qty 1

## 2018-08-14 MED ORDER — EPINEPHRINE PF 1 MG/ML IJ SOLN
INTRAMUSCULAR | Status: DC | PRN
  Administered 2018-08-14: .25 mg via INTRAVENOUS

## 2018-08-14 MED ORDER — CHLORHEXIDINE GLUCONATE 0.12% ORAL RINSE (MEDLINE KIT)
15.0000 mL | Freq: Two times a day (BID) | OROMUCOSAL | Status: DC
  Administered 2018-08-14 – 2018-08-15 (×2): 15 mL via OROMUCOSAL

## 2018-08-14 MED ORDER — EPINEPHRINE PF 1 MG/ML IJ SOLN
0.5000 ug/min | INTRAVENOUS | Status: DC
  Filled 2018-08-14: qty 4

## 2018-08-14 MED ORDER — PHENYLEPHRINE 40 MCG/ML (10ML) SYRINGE FOR IV PUSH (FOR BLOOD PRESSURE SUPPORT)
PREFILLED_SYRINGE | INTRAVENOUS | Status: DC | PRN
  Administered 2018-08-14 (×2): 200 ug via INTRAVENOUS

## 2018-08-14 MED ORDER — SODIUM CHLORIDE 0.9% FLUSH: 10.0000 mL | INTRAVENOUS | Status: DC | PRN

## 2018-08-14 MED ORDER — LIDOCAINE 2% (20 MG/ML) 5 ML SYRINGE
INTRAMUSCULAR | Status: DC | PRN
  Administered 2018-08-14: 100 mg via INTRAVENOUS
  Administered 2018-08-14: 80 mg via INTRAVENOUS

## 2018-08-14 MED ORDER — PHENYLEPHRINE 40 MCG/ML (10ML) SYRINGE FOR IV PUSH (FOR BLOOD PRESSURE SUPPORT)
PREFILLED_SYRINGE | INTRAVENOUS | Status: AC
  Filled 2018-08-14: qty 20

## 2018-08-14 MED ORDER — FENTANYL CITRATE (PF) 100 MCG/2ML IJ SOLN
INTRAMUSCULAR | Status: AC
  Filled 2018-08-14: qty 2

## 2018-08-14 MED ORDER — BISACODYL 5 MG PO TBEC
10.0000 mg | DELAYED_RELEASE_TABLET | Freq: Every day | ORAL | Status: DC
  Administered 2018-08-15: 10 mg via ORAL
  Filled 2018-08-14: qty 2

## 2018-08-14 MED ORDER — NOREPINEPHRINE 4 MG/250ML-% IV SOLN
0.0000 ug/min | INTRAVENOUS | Status: DC
  Filled 2018-08-14: qty 250

## 2018-08-14 MED ORDER — CEFAZOLIN SODIUM-DEXTROSE 2-4 GM/100ML-% IV SOLN
2.0000 g | Freq: Three times a day (TID) | INTRAVENOUS | Status: AC
  Administered 2018-08-14 – 2018-08-15 (×2): 2 g via INTRAVENOUS
  Filled 2018-08-14 (×2): qty 100

## 2018-08-14 SURGICAL SUPPLY — 63 items
APPLICATOR TIP EXT COSEAL (VASCULAR PRODUCTS) IMPLANT
CANISTER SUCT 3000ML PPV (MISCELLANEOUS) ×4 IMPLANT
CATH KIT ON Q 5IN SLV (PAIN MANAGEMENT) IMPLANT
CATH THORACIC 28FR (CATHETERS) IMPLANT
CATH THORACIC 36FR (CATHETERS) IMPLANT
CATH THORACIC 36FR RT ANG (CATHETERS) IMPLANT
CLIP VESOCCLUDE MED 6/CT (CLIP) IMPLANT
CONT SPEC 4OZ CLIKSEAL STRL BL (MISCELLANEOUS) ×8 IMPLANT
COVER SURGICAL LIGHT HANDLE (MISCELLANEOUS) ×4 IMPLANT
DERMABOND ADVANCED (GAUZE/BANDAGES/DRESSINGS)
DERMABOND ADVANCED .7 DNX12 (GAUZE/BANDAGES/DRESSINGS) IMPLANT
DRAPE LAPAROSCOPIC ABDOMINAL (DRAPES) ×4 IMPLANT
DRAPE WARM FLUID 44X44 (DRAPE) ×4 IMPLANT
ELECT BLADE 6.5 EXT (BLADE) ×4 IMPLANT
ELECT REM PT RETURN 9FT ADLT (ELECTROSURGICAL) ×4
ELECTRODE REM PT RTRN 9FT ADLT (ELECTROSURGICAL) ×2 IMPLANT
GAUZE SPONGE 4X4 12PLY STRL (GAUZE/BANDAGES/DRESSINGS) ×4 IMPLANT
GLOVE BIO SURGEON STRL SZ 6.5 (GLOVE) ×12 IMPLANT
GLOVE BIO SURGEONS STRL SZ 6.5 (GLOVE) ×4
GLOVE BIOGEL PI IND STRL 6.5 (GLOVE) ×12 IMPLANT
GLOVE BIOGEL PI INDICATOR 6.5 (GLOVE) ×12
GLOVE EUDERMIC 7 POWDERFREE (GLOVE) ×8 IMPLANT
GOWN STRL REUS W/ TWL LRG LVL3 (GOWN DISPOSABLE) ×10 IMPLANT
GOWN STRL REUS W/ TWL XL LVL3 (GOWN DISPOSABLE) ×2 IMPLANT
GOWN STRL REUS W/TWL LRG LVL3 (GOWN DISPOSABLE) ×10
GOWN STRL REUS W/TWL XL LVL3 (GOWN DISPOSABLE) ×2
HEMOSTAT SURGICEL 2X14 (HEMOSTASIS) IMPLANT
KIT BASIN OR (CUSTOM PROCEDURE TRAY) ×4 IMPLANT
KIT TURNOVER KIT B (KITS) ×4 IMPLANT
NS IRRIG 1000ML POUR BTL (IV SOLUTION) ×8 IMPLANT
PACK CHEST (CUSTOM PROCEDURE TRAY) ×4 IMPLANT
PAD ARMBOARD 7.5X6 YLW CONV (MISCELLANEOUS) ×8 IMPLANT
PAD DEFIB R2 (MISCELLANEOUS) ×8 IMPLANT
SEALANT SURG COSEAL 4ML (VASCULAR PRODUCTS) IMPLANT
SEALANT SURG COSEAL 8ML (VASCULAR PRODUCTS) IMPLANT
SOLUTION ANTI FOG 6CC (MISCELLANEOUS) ×4 IMPLANT
SUT PROLENE 3 0 SH DA (SUTURE) IMPLANT
SUT PROLENE 4 0 RB 1 (SUTURE)
SUT PROLENE 4-0 RB1 .5 CRCL 36 (SUTURE) IMPLANT
SUT SILK  1 MH (SUTURE) ×2
SUT SILK 1 MH (SUTURE) ×2 IMPLANT
SUT SILK 1 TIES 10X30 (SUTURE) IMPLANT
SUT SILK 2 0 SH (SUTURE) IMPLANT
SUT SILK 2 0SH CR/8 30 (SUTURE) IMPLANT
SUT VIC AB 1 CTX 18 (SUTURE) ×4 IMPLANT
SUT VIC AB 1 CTX 36 (SUTURE)
SUT VIC AB 1 CTX36XBRD ANBCTR (SUTURE) IMPLANT
SUT VIC AB 2-0 CTX 36 (SUTURE) ×4 IMPLANT
SUT VIC AB 2-0 UR6 27 (SUTURE) IMPLANT
SUT VIC AB 3-0 MH 27 (SUTURE) IMPLANT
SUT VIC AB 3-0 X1 27 (SUTURE) ×4 IMPLANT
SUT VICRYL 2 TP 1 (SUTURE) IMPLANT
SWAB COLLECTION DEVICE MRSA (MISCELLANEOUS) ×4 IMPLANT
SWAB CULTURE ESWAB REG 1ML (MISCELLANEOUS) ×4 IMPLANT
SYSTEM SAHARA CHEST DRAIN RE-I (WOUND CARE) ×4 IMPLANT
TAPE CLOTH 4X10 WHT NS (GAUZE/BANDAGES/DRESSINGS) ×4 IMPLANT
TAPE CLOTH SURG 4X10 WHT LF (GAUZE/BANDAGES/DRESSINGS) ×4 IMPLANT
TIP APPLICATOR SPRAY EXTEND 16 (VASCULAR PRODUCTS) IMPLANT
TOWEL GREEN STERILE (TOWEL DISPOSABLE) ×4 IMPLANT
TOWEL GREEN STERILE FF (TOWEL DISPOSABLE) ×8 IMPLANT
TRAP SPECIMEN MUCOUS 40CC (MISCELLANEOUS) ×4 IMPLANT
TRAY FOLEY MTR SLVR 14FR STAT (SET/KITS/TRAYS/PACK) ×4 IMPLANT
WATER STERILE IRR 1000ML POUR (IV SOLUTION) ×8 IMPLANT

## 2018-08-14 NOTE — Anesthesia Procedure Notes (Signed)
Procedure Name: Intubation Date/Time: 09/05/2018 3:57 PM Performed by: Ponciano Ort, CRNA Pre-anesthesia Checklist: Emergency Drugs available, Patient identified, Suction available and Patient being monitored Patient Re-evaluated:Patient Re-evaluated prior to induction Oxygen Delivery Method: Circle system utilized Preoxygenation: Pre-oxygenation with 100% oxygen Induction Type: IV induction Laryngoscope Size: Glidescope Grade View: Grade I Tube type: Subglottic suction tube Tube size: 8.0 mm Number of attempts: 1 Airway Equipment and Method: Stylet and Video-laryngoscopy Placement Confirmation: ETT inserted through vocal cords under direct vision,  positive ETCO2 and breath sounds checked- equal and bilateral Secured at: 23 cm Tube secured with: Tape Dental Injury: Teeth and Oropharynx as per pre-operative assessment  Comments: Tube exchanged advanced through DLT, DLT removed; intubated with 8.0 ETT. Uneventful procedure.

## 2018-08-14 NOTE — Anesthesia Procedure Notes (Signed)
Procedure Name: Intubation Date/Time: 08/21/2018 1:41 PM Performed by: Cleda Daub, CRNA Pre-anesthesia Checklist: Patient identified, Emergency Drugs available, Suction available and Patient being monitored Patient Re-evaluated:Patient Re-evaluated prior to induction Oxygen Delivery Method: Circle system utilized Preoxygenation: Pre-oxygenation with 100% oxygen Induction Type: IV induction Ventilation: Mask ventilation without difficulty and Mask ventilation throughout procedure Laryngoscope Size: Mac and 3 Grade View: Grade I Endobronchial tube: Double lumen EBT and Left and 39 Fr Number of attempts: 1 (intubated by Leighton Ruff. SRNA.) Airway Equipment and Method: Stylet and Fiberoptic brochoscope Placement Confirmation: ETT inserted through vocal cords under direct vision,  positive ETCO2 and breath sounds checked- equal and bilateral Tube secured with: Tape Dental Injury: Teeth and Oropharynx as per pre-operative assessment

## 2018-08-14 NOTE — Op Note (Signed)
CARDIOTHORACIC SURGERY OPERATIVE NOTE 09/08/2018 Allen Marshall [id number]  Surgeon: Alleen Borne, MD  First Assistant: Lowella Dandy, PA-C    Preoperative Diagnosis: Large right hemothorax  Postoperative Diagnosis: Same   Procedure:   1. Right muscle-sparing thoracotomy 2. Evacuation of hemothorax  3.  Decortication of right lung   Anesthesia: General Endotracheal   Clinical History/Surgical Indication:  [name] is a 77 year old male patient with past medical history of atrial fibrillation, hypertension, chronic kidney disease, chronic pulmonary embolism, GERD, chronic low back pain, and previous tobacco abuse who presented to the emergency department with shortness of breath. He has been having on and off shortness of breath for years but this time he "felt like he was dying".  The shortness of breath worsens with exertion.  He denies cough, pain, and wheezing.  He was recently in the emergency department on 07/29/2018 for shortness of breath.  He was found to have a small to moderate right pleural effusion.  On chest x-ray this admission he was found to have a large right pleural effusion and underwent a thoracentesis on 08/12/2018 which yielded 400 mL of dark old bloody pleural fluid.  Post thoracentesis a chest x-ray was obtained which showed no pneumothorax.  A persistent large right pleural effusion and significant right lung atelectasis.  A CT scan of the chest was also obtained which showed a large complex right-sided pleural effusion which is likely highly loculated and contains either hemorrhagic or proteinaceous contents. We were asked to see the patient for surgical drainage.  I discussed the operative procedure with the patient including alternatives, benefits and risks; including but not limited to bleeding, blood transfusion, infection, air leak from the lung, and recurrent effusion. Allen Marshall understands and agrees to  proceed.    Preparation:  The patient was seen in the preoperative holding area and the correct patient, correct operation were confirmed with the patient after reviewing the medical record and xrays. The right side of the chest was signed by me. The consent was signed by me. Preoperative antibiotics were given. A radial arterial line was placed by the anesthesia team. The patient was taken back to the operating room and positioned supine on the operating room table. After being placed under general endotracheal anesthesia by the anesthesia team using a double lumen tube a foley catheter was placed. Lower extremity SCD's were placed. A time out was taken and the correct patient, operation and operative side were confirmed with nursing and anesthesia staff. The patient was turned into the left lateral decubitus position with the right side up. The chest was prepped with betadine soap and solution and draped in the usual sterile manner. A surgical time-out was taken and the correct patient and operative procedure and operative side were confirmed with the nursing and anesthesia staff.    Right muscle-sparing thoracotomy:  The chest was entered through a lateral muscle-sparing thoracotomy incision. The pleural space was entered through the 6th ICS. The pleural space was completely filled with old clotted hemothorax and thick serosanguinous fluid. 3500 cc of fluid was drained and samples were sent for culture and cytology. The right lung was completely collapsed. One part of the RUL was firmly adherent to the anterior chest wall and this lung was not mobilized. Most of the lung was free. No nodules or masses were noted on the visceral or parietal pleural surfaces. There was a thin peel over the lung which was removed as completely as possible. The chest was irrigated  with saline. Hemostasis was complete.  Completion: One 36 F chest tubes was placed in the pleural space posteriorly up to the apex.  The  ribs were reapproximated with # 2 vicryl pericostal sutures with the sutures. The muscles were returned to their normal anatomic position. The subcutaneous tissue was closed with 2-0 vicryl suture and the skin with 3-0 vicryl subcuticular suture. The sponge, needle, and instrument counts were correct according to the nurses. Dry sterile dressings were applied and the chest tubes were connected to pleurevac suction. The patient was turned into the supine position, extubated, and transferred to the PACU in satisfactory and stable condition.

## 2018-08-14 NOTE — Consult Note (Signed)
ANTICOAGULATION CONSULT NOTE   Pharmacy Consult for Heparin drip Indication: atrial fibrillation/history of recurrent PE  Allergies  Allergen Reactions  . Mercury Hives    Patient Measurements: Height: 5\' 11"  (180.3 cm) Weight: 175 lb (79.4 kg)(Scale A) IBW/kg (Calculated) : 75.3 Heparin Dosing Weight: 79.4  Vital Signs: Temp: 97.9 F (36.6 C) (09/05 0400) Temp Source: Oral (09/05 0400) BP: 102/67 (09/05 0400) Pulse Rate: 38 (09/05 0400)  Labs: Recent Labs    08/12/18 0613 08/12/18 1722 08/13/18 0752 08/13/18 1853 09/05/2018 0428  HGB 9.4*  --  8.7*  --  9.6*  HCT 31.4*  --  28.9*  --  31.5*  PLT 417*  --  409*  --  347  LABPROT 20.7*  --   --   --   --   INR 1.80  --   --   --   --   HEPARINUNFRC  --   --   --  0.14* 0.34  CREATININE 1.73* 1.58*  --   --  1.32*    Estimated Creatinine Clearance: 49.9 mL/min (A) (by C-G formula based on SCr of 1.32 mg/dL (H)).   Medical History: Past Medical History:  Diagnosis Date  . Allergic reaction 06/17/2012   Oral edema; possibly food related  . Anemia    05/2011: H&H-10.2/31.3, MCV-92  . Arthritis   . Atrial fibrillation (HCC) <2011   Arrhythmia of uncertain duration, but it least one year; additional records pending as of 03/2013  . Benign prostatic hypertrophy   . Chronic anticoagulation   . Chronic kidney disease    05/2011: BUN/Cr- 15/1.24, GFR-68  . Degenerative joint disease of knee    Bilateral; also hips  . Erectile dysfunction   . Essential hypertension   . Gastroesophageal reflux    Frequent pyrosis  . Insomnia    Difficulty falling asleep  . Leg edema, right Chronic   Status post venectomy 1990s  . Low back pain   . Pulmonary embolism (HCC) 2003, 2014   Recurrent pulmonary embolism with subtherapeutic INR in 12/2012  . Tobacco abuse, in remission    40 pack years; discontinued 2006; office spirometry in 2012: Nl ex FEF 25-75%    Medications:  Scheduled:  . cefUROXime  1.5 g Intravenous On Call to  OR  . diltiazem  180 mg Oral Daily  . docusate sodium  100 mg Oral BID  . feeding supplement (ENSURE ENLIVE)  237 mL Oral BID BM  . ipratropium-albuterol  3 mL Nebulization BID  . metoprolol succinate  50 mg Oral Daily    Assessment: 77 y.o. Male was admitted on 9/1 for Pleural Effusion. PMH significant for recurrent PE, and PAF on Warfarin. INR was supratherapeutic on admission (>10), after Vitamin K administered (2x) INR was 1.8. Thoracentesis revealed blood in the pleural effusion - will not bolus. No other active bleeding in notes.    Heparin level now therapeutic. No bleeding issues noted.Plan to stop heparin 11am prior to VATS.   Goal of Therapy:  Heparin level 0.3-0.7 units/ml Monitor platelets by anticoagulation protocol: Yes   Plan:  Continue heparin at 1500 Heparin off at 11am F/u restart postop Thanks for allowing pharmacy to be a part of this patient's care.  11/1, PharmD Clinical Pharmacist    08/12/2018 5:59 AM

## 2018-08-14 NOTE — Progress Notes (Signed)
Patient taken to OR for VATS today. Discussed with APP from CT surgery. Plan is to transfer from PACU to ICU on CT surgery service. [facility] will sign off but will be happy to accept the patient once out of the unit. When the patient is stable for this, please call the flow manager at [phone].   Hazeline Junker, MD 30-Aug-2018 4:11 PM

## 2018-08-14 NOTE — Transfer of Care (Signed)
Immediate Anesthesia Transfer of Care Note  Patient: Allen Marshall  Procedure(s) Performed: VIDEO ASSISTED THORACOSCOPY (VATS)/THOROCOTOMY (Right Chest) DRAINAGE OF PLEURAL EFFUSION (Right ) DECORTICATION (Right )  Patient Location: ICU  Anesthesia Type:General  Level of Consciousness: Patient remains intubated per anesthesia plan  Airway & Oxygen Therapy: Patient remains intubated per anesthesia plan and Patient placed on Ventilator (see vital sign flow sheet for setting)  Post-op Assessment: Report given to RN and Post -op Vital signs reviewed and stable  Post vital signs: Reviewed and stable  Last Vitals:  Vitals Value Taken Time  BP 86/65 2018/08/30  4:48 PM  Temp    Pulse 66 08/30/18  4:48 PM  Resp 12 30-Aug-2018  4:48 PM  SpO2 100 % 30-Aug-2018  4:48 PM  Vitals shown include unvalidated device data.  Last Pain:  Vitals:   30-Aug-2018 1145  TempSrc: Oral  PainSc:       Patients Stated Pain Goal: 2 (08/11/18 1845)  Complications: No apparent anesthesia complications

## 2018-08-14 NOTE — Progress Notes (Signed)
      [address]       [place] [address]             [phone]      S/p R VATS for hemothorax  BP 108/64   Pulse 99   Temp 98 F (36.7 C) (Oral)   Resp 13   Ht 5\' 11"  (1.803 m)   Wt 79.4 kg Comment: Scale A  SpO2 100%   BMI 24.41 kg/m   Intake/Output Summary (Last 24 hours) at 08/12/2018 1808 Last data filed at 08/13/2018 1700 Gross per 24 hour  Intake 3725.64 ml  Output 3990 ml  Net -264.36 ml   Intubated and sedated Most recent ABG CO2= 44  Jkwon Treptow C. 10/14/2018, MD [facility] [phone]

## 2018-08-14 NOTE — Anesthesia Preprocedure Evaluation (Addendum)
Anesthesia Evaluation  Patient identified by MRN, date of birth, ID band Patient awake    Reviewed: Allergy & Precautions, NPO status , Patient's Chart, lab work & pertinent test results  Airway Mallampati: II  TM Distance: >3 FB     Dental   Pulmonary former smoker,    breath sounds clear to auscultation       Cardiovascular hypertension, + DOE   Rhythm:Regular Rate:Normal     Neuro/Psych    GI/Hepatic Neg liver ROS, GERD  ,  Endo/Other    Renal/GU Renal disease     Musculoskeletal  (+) Arthritis ,   Abdominal   Peds  Hematology  (+) anemia ,   Anesthesia Other Findings   Reproductive/Obstetrics                             Anesthesia Physical Anesthesia Plan  ASA: III  Anesthesia Plan: General   Post-op Pain Management:    Induction: Intravenous  PONV Risk Score and Plan: Treatment may vary due to age or medical condition, Ondansetron, Dexamethasone and Midazolam  Airway Management Planned: Double Lumen EBT  Additional Equipment:   Intra-op Plan:   Post-operative Plan: Possible Post-op intubation/ventilation  Informed Consent: I have reviewed the patients History and Physical, chart, labs and discussed the procedure including the risks, benefits and alternatives for the proposed anesthesia with the patient or authorized representative who has indicated his/her understanding and acceptance.   Dental advisory given  Plan Discussed with: CRNA and Anesthesiologist  Anesthesia Plan Comments:         Anesthesia Quick Evaluation

## 2018-08-14 NOTE — Anesthesia Procedure Notes (Signed)
Arterial Line Insertion Start/End09/23/2019 12:50 PM, 08/25/2018 1:00 PM Performed by: CRNA  Patient location: Pre-op. Preanesthetic checklist: patient identified, IV checked, site marked, risks and benefits discussed, surgical consent, monitors and equipment checked, pre-op evaluation and anesthesia consent Right, radial was placed Catheter size: 20 G Hand hygiene performed , maximum sterile barriers used  and Seldinger technique used Allen's test indicative of satisfactory collateral circulation Attempts: 1 Procedure performed without using ultrasound guided technique. Ultrasound Notes:anatomy identified, needle tip was noted to be adjacent to the nerve/plexus identified and no ultrasound evidence of intravascular and/or intraneural injection Following insertion, Biopatch and dressing applied. Post procedure assessment: normal  Patient tolerated the procedure well with no immediate complications. Additional procedure comments: Placed by Annitta Jersey, SRNA.Marland Kitchen

## 2018-08-14 NOTE — Anesthesia Procedure Notes (Addendum)
Central Venous Catheter Insertion Performed by: Dorris Singh, MD, anesthesiologist Start/End09/11/2018 12:55 PM, 09/01/2018 1:15 PM Patient location: Pre-op. Preanesthetic checklist: patient identified, IV checked, site marked, risks and benefits discussed, surgical consent, monitors and equipment checked, pre-op evaluation and timeout performed Position: Trendelenburg Lidocaine 1% used for infiltration and patient sedated Hand hygiene performed , maximum sterile barriers used  and Seldinger technique used Central line was placed.Double lumen Procedure performed using ultrasound guided technique. Ultrasound Notes:anatomy identified and image(s) printed for medical record Attempts: 1 Following insertion, line sutured. Post procedure assessment: blood return through all ports  Patient tolerated the procedure well with no immediate complications.

## 2018-08-15 ENCOUNTER — Inpatient Hospital Stay (HOSPITAL_COMMUNITY): Payer: Medicare HMO

## 2018-08-15 ENCOUNTER — Encounter (HOSPITAL_COMMUNITY): Payer: Self-pay | Admitting: Surgery

## 2018-08-15 ENCOUNTER — Inpatient Hospital Stay (HOSPITAL_COMMUNITY): Payer: Medicare HMO | Admitting: Certified Registered"

## 2018-08-15 LAB — GLUCOSE, CAPILLARY
GLUCOSE-CAPILLARY: 120 mg/dL — AB (ref 70–99)
Glucose-Capillary: 159 mg/dL — ABNORMAL HIGH (ref 70–99)
Glucose-Capillary: 142 mg/dL — ABNORMAL HIGH (ref 70–99)
Glucose-Capillary: 161 mg/dL — ABNORMAL HIGH (ref 70–99)
Glucose-Capillary: 228 mg/dL — ABNORMAL HIGH (ref 70–99)
Glucose-Capillary: 197 mg/dL — ABNORMAL HIGH (ref 70–99)

## 2018-08-15 LAB — BLOOD GAS, ARTERIAL
ACID-BASE DEFICIT: 0.9 mmol/L (ref 0.0–2.0)
Acid-base deficit: 0.4 mmol/L (ref 0.0–2.0)
Bicarbonate: 22.9 mmol/L (ref 20.0–28.0)
Bicarbonate: 22.8 mmol/L (ref 20.0–28.0)
FIO2: 50
FIO2: 100
MECHVT: 600 mL
O2 Saturation: 99.2 %
O2 Saturation: 96.2 %
PATIENT TEMPERATURE: 98.2
PCO2 ART: 32.1 mmHg (ref 32.0–48.0)
PCO2 ART: 34.7 mmHg (ref 32.0–48.0)
PEEP/CPAP: 5 cmH2O
PH ART: 7.433 (ref 7.350–7.450)
Patient temperature: 98.6
Pressure support: 10 cmH2O
RATE: 12 resp/min
pH, Arterial: 7.465 — ABNORMAL HIGH (ref 7.350–7.450)
pO2, Arterial: 145 mmHg — ABNORMAL HIGH (ref 83.0–108.0)
pO2, Arterial: 84.3 mmHg (ref 83.0–108.0)

## 2018-08-15 LAB — BASIC METABOLIC PANEL
Anion gap: 10 (ref 5–15)
BUN: 29 mg/dL — AB (ref 8–23)
CHLORIDE: 110 mmol/L (ref 98–111)
CO2: 22 mmol/L (ref 22–32)
Calcium: 8.1 mg/dL — ABNORMAL LOW (ref 8.9–10.3)
Creatinine, Ser: 1.45 mg/dL — ABNORMAL HIGH (ref 0.61–1.24)
GFR calc Af Amer: 52 mL/min — ABNORMAL LOW (ref >60)
GFR, EST NON AFRICAN AMERICAN: 45 mL/min — AB (ref >60)
Glucose, Bld: 161 mg/dL — ABNORMAL HIGH (ref 70–99)
POTASSIUM: 3.8 mmol/L (ref 3.5–5.1)
Sodium: 142 mmol/L (ref 135–145)

## 2018-08-15 LAB — POCT I-STAT 3, ART BLOOD GAS (G3+)
ACID-BASE DEFICIT: 11 mmol/L — AB (ref 0.0–2.0)
Acid-base deficit: 2 mmol/L (ref 0.0–2.0)
Acid-base deficit: 10 mmol/L — ABNORMAL HIGH (ref 0.0–2.0)
BICARBONATE: 21 mmol/L (ref 20.0–28.0)
BICARBONATE: 13.4 mmol/L — AB (ref 20.0–28.0)
Bicarbonate: 16.1 mmol/L — ABNORMAL LOW (ref 20.0–28.0)
O2 SAT: 91 %
O2 SAT: 83 %
O2 Saturation: 96 %
PCO2 ART: 34.9 mmHg (ref 32.0–48.0)
PH ART: 7.458 — AB (ref 7.350–7.450)
Patient temperature: 98
Patient temperature: 97.2
TCO2: 22 mmol/L (ref 22–32)
TCO2: 14 mmol/L — ABNORMAL LOW (ref 22–32)
TCO2: 17 mmol/L — AB (ref 22–32)
pCO2 arterial: 29.5 mmHg — ABNORMAL LOW (ref 32.0–48.0)
pCO2 arterial: 24.4 mmHg — ABNORMAL LOW (ref 32.0–48.0)
pH, Arterial: 7.346 — ABNORMAL LOW (ref 7.350–7.450)
pH, Arterial: 7.27 — ABNORMAL LOW (ref 7.350–7.450)
pO2, Arterial: 56 mmHg — ABNORMAL LOW (ref 83.0–108.0)
pO2, Arterial: 82 mmHg — ABNORMAL LOW (ref 83.0–108.0)
pO2, Arterial: 51 mmHg — ABNORMAL LOW (ref 83.0–108.0)

## 2018-08-15 LAB — CBC
HEMATOCRIT: 29.3 % — AB (ref 39.0–52.0)
Hemoglobin: 8.9 g/dL — ABNORMAL LOW (ref 13.0–17.0)
MCH: 28.8 pg (ref 26.0–34.0)
MCHC: 30.4 g/dL (ref 30.0–36.0)
MCV: 94.8 fL (ref 78.0–100.0)
PLATELETS: 198 10*3/uL (ref 150–400)
RBC: 3.09 MIL/uL — ABNORMAL LOW (ref 4.22–5.81)
RDW: 16 % — AB (ref 11.5–15.5)
WBC: 8.8 10*3/uL (ref 4.0–10.5)

## 2018-08-15 LAB — COOXEMETRY PANEL
Carboxyhemoglobin: 1.2 % (ref 0.5–1.5)
Carboxyhemoglobin: 1.2 % (ref 0.5–1.5)
METHEMOGLOBIN: 1.5 % (ref 0.0–1.5)
METHEMOGLOBIN: 0.8 % (ref 0.0–1.5)
O2 Saturation: 26 %
O2 Saturation: 27.3 %
TOTAL HEMOGLOBIN: 10.3 g/dL — AB (ref 12.0–16.0)
Total hemoglobin: 10.5 g/dL — ABNORMAL LOW (ref 12.0–16.0)

## 2018-08-15 LAB — TROPONIN I: Troponin I: 0.13 ng/mL (ref <0.03)

## 2018-08-15 LAB — PREPARE RBC (CROSSMATCH)

## 2018-08-15 MED ORDER — SODIUM BICARBONATE 8.4 % IV SOLN
INTRAVENOUS | Status: AC
  Filled 2018-08-15: qty 150

## 2018-08-15 MED ORDER — AMIODARONE LOAD VIA INFUSION
150.0000 mg | Freq: Once | INTRAVENOUS | Status: DC
  Filled 2018-08-15: qty 83.34

## 2018-08-15 MED ORDER — LEVALBUTEROL HCL 0.63 MG/3ML IN NEBU
0.6300 mg | INHALATION_SOLUTION | Freq: Three times a day (TID) | RESPIRATORY_TRACT | Status: DC
  Administered 2018-08-15 (×2): 0.63 mg via RESPIRATORY_TRACT
  Filled 2018-08-15 (×2): qty 3

## 2018-08-15 MED ORDER — AMIODARONE HCL IN DEXTROSE 360-4.14 MG/200ML-% IV SOLN
60.0000 mg/h | INTRAVENOUS | Status: DC
  Filled 2018-08-15: qty 200

## 2018-08-15 MED ORDER — ETOMIDATE 2 MG/ML IV SOLN
INTRAVENOUS | Status: DC | PRN
  Administered 2018-08-15: 10 mg via INTRAVENOUS

## 2018-08-15 MED ORDER — PHENYLEPHRINE HCL 10 MG/ML IJ SOLN
INTRAMUSCULAR | Status: DC | PRN
  Administered 2018-08-15: 200 ug via INTRAVENOUS
  Administered 2018-08-15 (×2): 100 ug via INTRAVENOUS

## 2018-08-15 MED ORDER — MILRINONE LACTATE IN DEXTROSE 20-5 MG/100ML-% IV SOLN
0.3000 ug/kg/min | INTRAVENOUS | Status: DC
  Administered 2018-08-15: 0.3 ug/kg/min via INTRAVENOUS
  Filled 2018-08-15: qty 100

## 2018-08-15 MED ORDER — DIGOXIN 0.25 MG/ML IJ SOLN
0.2500 mg | Freq: Four times a day (QID) | INTRAMUSCULAR | Status: DC
  Administered 2018-08-15: 0.25 mg via INTRAVENOUS
  Filled 2018-08-15: qty 2

## 2018-08-15 MED ORDER — SUCCINYLCHOLINE CHLORIDE 20 MG/ML IJ SOLN
INTRAMUSCULAR | Status: DC | PRN
  Administered 2018-08-15: 100 mg via INTRAVENOUS

## 2018-08-15 MED ORDER — EPHEDRINE SULFATE 50 MG/ML IJ SOLN
INTRAMUSCULAR | Status: DC | PRN
  Administered 2018-08-15: 10 mg via INTRAVENOUS
  Administered 2018-08-15: 15 mg via INTRAVENOUS

## 2018-08-15 MED ORDER — PHENYLEPHRINE HCL-NACL 40-0.9 MG/250ML-% IV SOLN
0.0000 ug/min | INTRAVENOUS | Status: DC
  Administered 2018-08-15: 100 ug/min via INTRAVENOUS
  Administered 2018-08-15: 65 ug/min via INTRAVENOUS
  Filled 2018-08-15 (×3): qty 250

## 2018-08-15 MED ORDER — CHLORHEXIDINE GLUCONATE CLOTH 2 % EX PADS: 6.0000 | MEDICATED_PAD | Freq: Every day | CUTANEOUS | Status: DC

## 2018-08-15 MED ORDER — SODIUM BICARBONATE 8.4 % IV SOLN
150.0000 meq | Freq: Once | INTRAVENOUS | Status: AC
  Administered 2018-08-15: 150 meq via INTRAVENOUS
  Filled 2018-08-15: qty 150

## 2018-08-15 MED ORDER — NOREPINEPHRINE 4 MG/250ML-% IV SOLN
0.0000 ug/min | INTRAVENOUS | Status: DC
  Administered 2018-08-15: 5 ug/min via INTRAVENOUS
  Administered 2018-08-15: 20 ug/min via INTRAVENOUS
  Filled 2018-08-15 (×3): qty 250

## 2018-08-15 MED ORDER — SODIUM CHLORIDE 0.9% IV SOLUTION
Freq: Once | INTRAVENOUS | Status: AC
  Administered 2018-08-15: 15:00:00 via INTRAVENOUS

## 2018-08-15 MED ORDER — AMIODARONE HCL IN DEXTROSE 360-4.14 MG/200ML-% IV SOLN: 30.0000 mg/h | INTRAVENOUS | Status: DC

## 2018-08-15 MED ORDER — EPINEPHRINE PF 1 MG/10ML IJ SOSY
PREFILLED_SYRINGE | INTRAMUSCULAR | Status: DC | PRN
  Administered 2018-08-15: 50 ug via INTRAVENOUS

## 2018-08-15 MED ORDER — MUPIROCIN 2 % EX OINT
1.0000 "application " | TOPICAL_OINTMENT | Freq: Two times a day (BID) | CUTANEOUS | Status: DC
  Administered 2018-08-15: 1 via NASAL
  Filled 2018-08-15: qty 22

## 2018-08-16 NOTE — Addendum Note (Signed)
Addendum  created 09/08/2018 0544 by Dorris Singh, MD   Attestation recorded in Intraprocedure, Intraprocedure Attestations filed

## 2018-08-17 LAB — TYPE AND SCREEN
ABO/RH(D): B POS
Antibody Screen: NEGATIVE
UNIT DIVISION: 0
UNIT DIVISION: 0
UNIT DIVISION: 0
Unit division: 0
Unit division: 0
Unit division: 0

## 2018-08-17 LAB — BPAM RBC
BLOOD PRODUCT EXPIRATION DATE: 201909272359
BLOOD PRODUCT EXPIRATION DATE: 201909272359
BLOOD PRODUCT EXPIRATION DATE: 201909272359
Blood Product Expiration Date: 201909272359
Blood Product Expiration Date: 201909272359
Blood Product Expiration Date: 201909272359
ISSUE DATE / TIME: 201909051359
ISSUE DATE / TIME: 201909051359
ISSUE DATE / TIME: 201909051453
ISSUE DATE / TIME: 201909061423
UNIT TYPE AND RH: 7300
UNIT TYPE AND RH: 7300
Unit Type and Rh: 7300
Unit Type and Rh: 7300
Unit Type and Rh: 7300
Unit Type and Rh: 7300

## 2018-08-17 LAB — CULTURE, BODY FLUID-BOTTLE

## 2018-08-17 LAB — CULTURE, BODY FLUID W GRAM STAIN -BOTTLE: Culture: NO GROWTH

## 2018-08-19 LAB — AEROBIC/ANAEROBIC CULTURE (SURGICAL/DEEP WOUND)

## 2018-08-19 LAB — AEROBIC/ANAEROBIC CULTURE W GRAM STAIN (SURGICAL/DEEP WOUND)
Culture: NO GROWTH
Gram Stain: NONE SEEN

## 2018-08-22 DIAGNOSIS — M545 Low back pain: Secondary | ICD-10-CM | POA: Diagnosis not present

## 2018-08-22 DIAGNOSIS — I10 Essential (primary) hypertension: Secondary | ICD-10-CM | POA: Diagnosis not present

## 2018-08-22 DIAGNOSIS — G894 Chronic pain syndrome: Secondary | ICD-10-CM | POA: Diagnosis not present

## 2018-08-22 DIAGNOSIS — I2782 Chronic pulmonary embolism: Secondary | ICD-10-CM | POA: Diagnosis not present

## 2018-08-22 DIAGNOSIS — R7301 Impaired fasting glucose: Secondary | ICD-10-CM | POA: Diagnosis not present

## 2018-08-22 DIAGNOSIS — M130 Polyarthritis, unspecified: Secondary | ICD-10-CM | POA: Diagnosis not present

## 2018-08-22 DIAGNOSIS — K219 Gastro-esophageal reflux disease without esophagitis: Secondary | ICD-10-CM | POA: Diagnosis not present

## 2018-08-22 DIAGNOSIS — R0600 Dyspnea, unspecified: Secondary | ICD-10-CM | POA: Diagnosis not present

## 2018-08-22 DIAGNOSIS — I482 Chronic atrial fibrillation: Secondary | ICD-10-CM | POA: Diagnosis not present

## 2018-09-09 NOTE — Anesthesia Procedure Notes (Addendum)
Procedure Name: Intubation Date/Time: 09-12-18 8:00 PM Performed by: Babs Bertin, CRNA Pre-anesthesia Checklist: Patient identified, Emergency Drugs available, Suction available and Patient being monitored Patient Re-evaluated:Patient Re-evaluated prior to induction Oxygen Delivery Method: Ambu bag Preoxygenation: Pre-oxygenation with 100% oxygen Induction Type: IV induction and Rapid sequence Laryngoscope Size: Mac and 3 Tube type: Subglottic suction tube Tube size: 8.0 mm Number of attempts: 1 Airway Equipment and Method: Stylet Placement Confirmation: ETT inserted through vocal cords under direct vision,  breath sounds checked- equal and bilateral and CO2 detector Secured at: 22 cm Tube secured with: Tape Dental Injury: Teeth and Oropharynx as per pre-operative assessment

## 2018-09-09 NOTE — Anesthesia Postprocedure Evaluation (Signed)
Anesthesia Post Note  Patient: Allen Marshall  Procedure(s) Performed: VIDEO ASSISTED THORACOSCOPY (VATS)/THOROCOTOMY (Right Chest) DRAINAGE OF PLEURAL EFFUSION (Right ) DECORTICATION (Right )     Patient location during evaluation: ICU Anesthesia Type: General Level of consciousness: patient remains intubated per anesthesia plan Pain management: pain level controlled Vital Signs Assessment: post-procedure vital signs reviewed and stable Respiratory status: patient remains intubated per anesthesia plan Cardiovascular status: stable Postop Assessment: no apparent nausea or vomiting Anesthetic complications: no    Last Vitals:  Vitals:   08/26/18 1813 08-26-18 1815  BP: (!) 70/49 (!) 87/53  Pulse: (!) 124 (!) 124  Resp: (!) 32 (!) 31  Temp:    SpO2: 94% 95%    Last Pain:  Vitals:   08/26/2018 1645  TempSrc: Axillary  PainSc: 0-No pain                 Tiearra Colwell

## 2018-09-09 NOTE — Death Summary Note (Signed)
DEATH SUMMARY   Patient Details  Name: Allen Marshall MRN: [id number] DOB: [birth year]  Admission/Discharge Information   Admit Date:  08/22/2018  Date of Death: Date of Death: 08/27/2018  Time of Death: Time of Death: 04-07-99  Length of Stay: 6  Referring Physician: Benita Stabile, MD   Reason(s) for Hospitalization  Hemothorax  Diagnoses  Preliminary cause of death: Acute cardiac failure  Secondary Diagnoses (including complications and co-morbidities):  Principal Problem:   Pleural effusion Active Problems:   Chronic anticoagulation   Atrial fibrillation (HCC)   Chronic pulmonary embolism (HCC)   Hypertension   Impaired fasting glucose   CKD (chronic kidney disease), stage III (HCC)   Supratherapeutic INR   Protein-calorie malnutrition, severe   S/P thoracotomy   Brief Hospital Course (including significant findings, care, treatment, and services provided and events leading to death)  Allen Marshall is a 77 y.o. year old male who presented to [facility] with a few week history of shortness of breath and a CXR showed a large right pleural effusion. His INR on Coumadin was 10. This was corrected and a thoracentesis removed 400 cc of bloody fluid. He still had a large effusion and was transferred to [facility] for further care.  A CT scan of the chest showed a large complex right pleural effusion which appeared to be hemothorax with near complete collapse of the right lung.  He was taken the operating room on 08/22/2018 and underwent right thoracotomy with evacuation of a large hemothorax and decortication of the right lung.  He had a somewhat labile blood pressure after being placed under general anesthesia but this settled out and he remained stable throughout the remainder the procedure.  He was taken the intensive care unit intubated and remained hemodynamically stable on the ventilator overnight.  On the morning of postop day 1 he was weaned and extubated after having an acceptable  arterial blood gas and weaning parameters performed by respiratory therapy.  Over the course of the day he developed hemodynamically instability with increasing pressor requirement and development of a metabolic acidosis.  A venous oxygen saturation was measured at 27.  Repeat was 26.  He was started on milrinone in addition to norepinephrine and Neo-Synephrine to support his blood pressure.  Electrocardiogram showed nonspecific changes.  His troponin was 0.1.  Repeat chest x-ray was unchanged with no pneumothorax or pleural effusion .despite increasing doses of pressors and sodium bicarbonate he continued to deteriorate and required intubation.  Shortly after that he developed PEA arrest.  Since he was already on high-dose levo fed and Neo-Synephrine as well as milrinone and receiving intermittent epinephrine boluses I do not feel that further resuscitation was indicated and he expired at 9:00 PM.  His sister arrived shortly after that and I reviewed the clinical course with her.    Pertinent Labs and Studies  Significant Diagnostic Studies Dg Chest 1 View  Result Date: 08/12/2018 CLINICAL DATA:  RIGHT pleural effusion post thoracentesis EXAM: CHEST  1 VIEW COMPARISON:  22-Aug-2018 FINDINGS: Persistent large RIGHT pleural effusion and significant RIGHT lung atelectasis. No pneumothorax following thoracentesis. LEFT lung clear. Atherosclerotic calcification aorta. Heart size stable. IMPRESSION: No pneumothorax following RIGHT thoracentesis. Persistent large RIGHT pleural effusion and significant RIGHT lung atelectasis. Electronically Signed   By: Ulyses Southward M.D.   On: 08/12/2018 11:56   Dg Chest 2 View  Result Date: 2018/08/22 CLINICAL DATA:  Patient with worsening shortness of breath. EXAM: CHEST - 2 VIEW COMPARISON:  Chest radiograph 07/29/2018 FINDINGS: Monitoring leads overlie the patient. Stable cardiomegaly. Aortic atherosclerosis. Interval increase in size of large right pleural effusion with  underlying consolidation. No pneumothorax. Left lung is clear. IMPRESSION: Interval increase in size of large effusion with underlying consolidation. Electronically Signed   By: Annia Belt M.D.   On: August 13, 2018 16:42   Dg Chest 2 View  Result Date: 07/29/2018 CLINICAL DATA:  Short of breath for 6 weeks. EXAM: CHEST - 2 VIEW COMPARISON:  04/14/2018 chest CT. FINDINGS: Midline trachea. Mild cardiomegaly. Atherosclerosis in the transverse aorta. Small to moderate right pleural effusion, new since the prior CT. No pneumothorax. Suspect mild pulmonary venous congestion. Right base airspace disease. IMPRESSION: Cardiomegaly with suspicion of mild pulmonary venous congestion. Small to moderate right pleural effusion with adjacent Airspace disease, likely atelectasis. Electronically Signed   By: Jeronimo Greaves M.D.   On: 07/29/2018 19:44   Ct Chest Wo Contrast  Result Date: 08/12/2018 CLINICAL DATA:  77 year old male with history of increasing shortness of breath after thoracentesis today. Pleural effusion. EXAM: CT CHEST WITHOUT CONTRAST TECHNIQUE: Multidetector CT imaging of the chest was performed following the standard protocol without IV contrast. COMPARISON:  Chest CT 04/14/2018. FINDINGS: Cardiovascular: Heart size is normal. There is no significant pericardial fluid, thickening or pericardial calcification. There is aortic atherosclerosis, as well as atherosclerosis of the great vessels of the mediastinum and the coronary arteries, including calcified atherosclerotic plaque in the left anterior descending, left circumflex and right coronary arteries. Mediastinum/Nodes: No pathologically enlarged mediastinal or hilar lymph nodes. Please note that accurate exclusion of hilar adenopathy is limited on noncontrast CT scans. Esophagus is unremarkable in appearance. No axillary lymphadenopathy. Lungs/Pleura: Large complex right pleural effusion with heterogeneous attenuation ranging from low to high attenuation,  presumably reflective of internal blood products. Based on the complex appearance, this appears to be likely highly loculated. Extensive passive atelectasis throughout most of the right lung, with small amount of aerated right upper lobe. Small calcified pleural plaques in the right hemithorax. No suspicious appearing pulmonary nodules or masses are noted in the left lung. No left pleural effusion. Upper Abdomen: Aortic atherosclerosis. Musculoskeletal: There are no aggressive appearing lytic or blastic lesions noted in the visualized portions of the skeleton. IMPRESSION: 1. Large complex right-sided pleural effusion which is likely highly loculated and contains either hemorrhagic or proteinaceous contents. 2. Aortic atherosclerosis, in addition to three-vessel coronary artery disease. Please note that although the presence of coronary artery calcium documents the presence of coronary artery disease, the severity of this disease and any potential stenosis cannot be assessed on this non-gated CT examination. Assessment for potential risk factor modification, dietary therapy or pharmacologic therapy may be warranted, if clinically indicated. 3. Additional incidental findings, as above. Aortic Atherosclerosis (ICD10-I70.0). Electronically Signed   By: Trudie Reed M.D.   On: 08/12/2018 20:54   Dg Chest Port 1 View  Result Date: 08/13/2018 CLINICAL DATA: Prior thoracotomy. EXAM: PORTABLE CHEST 1 VIEW COMPARISON:  08/12/2018. FINDINGS: Interim extubation removal of NG tube. Right IJ line and right chest tubes in stable position. Persistent atelectatic changes and infiltrate in the right lung base with slight improvement in aeration. Small right pleural effusion again noted. No pneumothorax. Continued improvement of right chest wall subcutaneous emphysema. No acute abnormality identified. Thoracic spine scoliosis. IMPRESSION: Interim extubation removal of NG tube. Right IJ line right chest tubes in stable position.  Persistent atelectatic changes and infiltrate right lung base with slight improvement in aeration. Small right pleural effusion  again noted. No pneumothorax. Continued improvement of right chest wall subcutaneous emphysema. Electronically Signed   By: Maisie Fus  Register   On: 08/23/2018 15:04   Dg Chest Port 1 View  Result Date: 09/06/2018 CLINICAL DATA:  Intubation. EXAM: PORTABLE CHEST 1 VIEW COMPARISON:  Aug 18, 2018. FINDINGS: Endotracheal tube and NG tube including right chest tubes in stable position. Right IJ line and right chest tube in stable position. No pneumothorax. Cardiomegaly with normal pulmonary vascularity. Right base atelectasis and infiltrate. Right-sided pleural effusions. The right pneumothorax. Right chest wall subcutaneous emphysema. IMPRESSION: 1. Lines and tubes including right chest tubes in stable position. Previously identified right pneumothorax has resolved. Improving right chest wall subcutaneous emphysema. 2. Persistent atelectasis/infiltrate right lung base. Persistent right-sided pleural effusion. Electronically Signed   By: Maisie Fus  Register   On: 08/17/2018 09:34   Dg Chest Port 1 View  Result Date: August 18, 2018 CLINICAL DATA:  Status post thoracotomy EXAM: PORTABLE CHEST 1 VIEW COMPARISON:  08/12/2018, 08/18/2018 FINDINGS: Endotracheal tube tip is about 3.3 cm superior to the carina. Esophageal tube tip is in the left upper quadrant over the proximal stomach. Left lung is grossly clear. Interim placement of right-sided chest tube with slight angulated appearance at the right cardio phrenic angle. Decreased right pleural effusion with small right lower lateral pneumothorax demonstrating 11 mm pleural-parenchymal separation. No shift. Atelectasis in the right mid lung. Moderate gas within the chest wall soft tissues. Right IJ central venous catheter tip over the SVC. IMPRESSION: 1. Interval intubation with tip of the endotracheal tube about 3.3 cm superior to carina. Esophageal  tube tip below the diaphragm and in the left upper quadrant. 2. Interval evacuation of complex right pleural effusion. Small right mid to lower lateral pneumothorax demonstrating 11 mm pleural-parenchymal separation, no shift. Right-sided chest tube is in place, there is somewhat abrupt angular appearance of the chest tube at the right cardio phrenic sulcus. 3. Right perihilar atelectasis. Electronically Signed   By: Jasmine Pang M.D.   On: 08-18-2018 17:36   US Abdomen Limited Ruq  Result Date: 08/12/2018 CLINICAL DATA:  Hyperbilirubinemia EXAM: ULTRASOUND ABDOMEN LIMITED RIGHT UPPER QUADRANT COMPARISON:  None FINDINGS: Gallbladder: Significant sludge within gallbladder. No shadowing gallstones, gallbladder wall thickening or pericholecystic fluid. No sonographic Murphy sign. Common bile duct: Diameter: 7 mm diameter, normal for age Liver: Normal appearance. No focal hepatic mass or nodularity. Portal vein is patent on color Doppler imaging with normal direction of blood flow towards the liver. Incidentally noted loculated RIGHT pleural effusion. IMPRESSION: Significant sludge within gallbladder. Loculated complex RIGHT pleural effusion. Electronically Signed   By: Ulyses Southward M.D.   On: 08/12/2018 11:57   US Thoracentesis Asp Pleural Space W/img Guide  Result Date: 08/12/2018 INDICATION: RIGHT pleural effusion, presented hospital with INR greater than 10 EXAM: ULTRASOUND GUIDED DIAGNOSTIC RIGHT THORACENTESIS MEDICATIONS: None. COMPLICATIONS: None immediate. PROCEDURE: Procedure, benefits, and risks of procedure were discussed with patient. Written informed consent for procedure was obtained. Time out protocol followed. Pleural effusion localized by ultrasound at the posterior RIGHT hemithorax. Visualized RIGHT pleural fluid is markedly complex, containing numerous loculations, septa, and low level internal echogenicity. Skin prepped and draped in usual sterile fashion. Skin and soft tissues anesthetized  with 10 mL of 1% lidocaine. 8 French thoracentesis catheter placed into the RIGHT pleural space. 400 mL of dark old bloody fluid aspirated by syringe pump. Procedure tolerated well by patient without immediate complication. FINDINGS: A total of approximately 400 mL of hemorrhagic RIGHT pleural fluid was removed. Samples  were sent to the laboratory as requested by the clinical team. IMPRESSION: Successful ultrasound guided RIGHT thoracentesis yielding 400 mL of dark old bloody pleural fluid / prior hemothorax. Electronically Signed   By: Ulyses Southward M.D.   On: 08/12/2018 12:02    Microbiology Recent Results (from the past 240 hour(s))  Culture, body fluid-bottle     Status: None   Collection Time: 08/12/18 11:10 AM  Result Value Ref Range Status   Specimen Description FLUID RIGHT PLEURAL COLLECTED BY DOCTOR  Final   Special Requests BOTTLES DRAWN AEROBIC AND ANAEROBIC 10CC EACH  Final   Culture   Final    NO GROWTH 5 DAYS Performed at [facility], [address] [place], [place] [address]    Report Status 08/17/2018 FINAL  Final  Gram stain     Status: None   Collection Time: 08/12/18 11:10 AM  Result Value Ref Range Status   Specimen Description FLUID RIGHT PLEURAL  Final   Special Requests NONE  Final   Gram Stain   Final    ABUNDANT WBC PRESENT,BOTH PMN AND MONONUCLEAR NO ORGANISMS SEEN Performed at [facility] Performed at [facility], [address] [place], [place] [address]    Report Status 08/12/2018 FINAL  Final  MRSA PCR Screening     Status: Abnormal   Collection Time: 09/04/2018 11:57 AM  Result Value Ref Range Status   MRSA by PCR POSITIVE (A) NEGATIVE Final    Comment:        The GeneXpert MRSA Assay (FDA approved for NASAL specimens only), is one component of a comprehensive MRSA colonization surveillance program. It is not intended to diagnose MRSA infection nor to guide or monitor treatment for MRSA infections. RESULT CALLED TO, READ BACK BY AND  VERIFIED WITH: Macario Carls RN 13:40 09/04/2018 ([email or web address]) Performed at [facility], [address] [place], [place] [address]   Aerobic/Anaerobic Culture (surgical/deep wound)     Status: None (Preliminary result)   Collection Time: 08/19/2018  2:24 PM  Result Value Ref Range Status   Specimen Description PLEURAL FLUID  Final   Special Requests RIGHT  Final   Gram Stain NO WBC SEEN NO ORGANISMS SEEN   Final   Culture   Final    NO GROWTH 4 DAYS NO ANAEROBES ISOLATED; CULTURE IN PROGRESS FOR 5 DAYS Performed at [facility], [address] [place], [place] [address]    Report Status PENDING  Incomplete    Lab Basic Metabolic Panel: Recent Labs  Lab 08/12/18 0613 08/12/18 1722 09/04/2018 0428 09/02/2018 1359 08/29/2018 1450 09/07/2018 1455 08/19/2018 1545 Sep 04, 2018 0415  NA 138 136 140 140 141 140 143 142  K 4.8 3.7 3.9 3.6 3.9 3.7 4.4 3.8  CL 103 101 107  --   --   --   --  110  CO2 24 26 23   --   --   --   --  22  GLUCOSE 138* 173* 131*  --   --  157*  --  161*  BUN 56* 54* 38*  --   --   --   --  29*  CREATININE 1.73* 1.58* 1.32*  --   --   --   --  1.45*  CALCIUM 8.4* 8.3* 8.3*  --   --   --   --  8.1*   Liver Function Tests: Recent Labs  Lab 08/12/18 0613 08/12/18 1722  AST 13* 15  ALT 6 7  ALKPHOS 38 45  BILITOT 2.8* 2.3*  PROT 6.4* 6.7  ALBUMIN 2.9* 3.0*   No results for input(s): LIPASE, AMYLASE in the last 168 hours. No results for input(s): AMMONIA in the last 168 hours. CBC: Recent Labs  Lab 08/12/18 0613 08/13/18 0752 09/02/2018 0428  Aug 16, 2018 1450 16-Aug-2018 1455 09/03/2018 1545 09/03/2018 1659 08/12/2018 0415  WBC 6.8 6.6 7.7  --   --   --   --  7.7 8.8  HGB 9.4* 8.7* 9.6*   < > 8.8* 9.2* 10.2* 9.7* 8.9*  HCT 31.4* 28.9* 31.5*   < > 26.0* 27.0* 30.0* 31.7* 29.3*  MCV 95.2 95.7 95.2  --   --   --   --  94.1 94.8  PLT 417* 409* 347  --   --   --   --  339 198   < > = values in this interval not displayed.   Cardiac Enzymes: Recent Labs  Lab  09/08/2018 1732  TROPONINI 0.13*   Sepsis Labs: Recent Labs  Lab 08/13/18 0752 08/17/2018 0428 Aug 16, 2018 1659 08/31/2018 0415  WBC 6.6 7.7 7.7 8.8    Procedures/Operations  1.  Right muscle-sparing thoracotomy with evacuation of hemothorax and decortication of the right lung on 08/23/2018    Payton Doughty Leniya Breit 08/18/2018, 12:00 PM

## 2018-09-09 NOTE — Discharge Instructions (Signed)
Thoracotomy, Care After ° °This sheet gives you information about how to care for yourself after your procedure. Your doctor may also give you more specific instructions. If you have problems or questions, contact your doctor. °Follow these instructions at home: °Preventing lung infection (  °pneumonia) °· Take deep breaths or do breathing exercises as told by your doctor. °· Cough often. Coughing is important to clear thick spit (phlegm) and open your lungs. If coughing hurts, hold a pillow against your chest or place both hands flat on top of your cut (splinting) when you cough. This may help with discomfort. °· Use an incentive spirometer as told. This is a tool that measures how well you fill your lungs with each breath. °· Do lung therapy (pulmonary rehabilitation) as told. °Medicines °· Take over-the-counter or prescription medicines only as told by your doctor. °· If you have pain, take pain-relieving medicine before your pain gets very bad. This will help you breathe and cough more comfortably. °· If you were prescribed an antibiotic medicine, take it as told by your doctor. Do not stop taking the antibiotic even if you start to feel better. °Activity °· Ask your doctor what activities are safe for you. °· Do not travel by airplane for 2 weeks after your chest tube is removed, or until your doctor says that this is safe. °· Do not lift anything that is heavier than 10 lb (4.5 kg), or the limit that your doctor tells you, until he or she says that it is safe. °· Do not drive until your doctor approves. °¨ Do not drive or use heavy machinery while taking prescription pain medicine. °Incision care °· Follow instructions from your doctor about how to take care of your cut from surgery (incision). Make sure you: °¨ Wash your hands with soap and water before you change your bandage (dressing). If you cannot use soap and water, use hand sanitizer. °¨ Change your bandage as told by your doctor. °¨ Leave stitches  (sutures), skin glue, or skin tape (adhesive) strips in place. They may need to stay in place for 2 weeks or longer. If tape strips get loose and curl up, you may trim the loose edges. Do not remove tape strips completely unless your doctor says it is okay. °· Keep your bandage dry. °· Check your cut from surgery every day for signs of infection. Check for: °¨ More redness, swelling, or pain. °¨ More fluid or blood. °¨ Warmth. °¨ Pus or a bad smell. °Bathing °· Do not take baths, swim, or use a hot tub until your doctor approves. You may take showers. °· After your bandage has been removed, use soap and water to gently wash your cut from surgery. Do not use anything else to clean your cut unless your doctor tells you to. °Eating and drinking °· Eat a healthy diet as told by your doctor. A healthy diet includes: °¨ Fresh fruits and vegetables. °¨ Whole grains. °¨ Low-fat (lean) proteins. °· Drink enough fluid to keep your pee (urine) clear or pale yellow. °General instructions °· To prevent or treat trouble pooping (constipation) while you are taking prescription pain medicine, your doctor may recommend that you: °¨ Take over-the-counter or prescription medicines. °¨ Eat foods that are high in fiber. These include fresh fruits and vegetables, whole grains, and beans. °¨ Limit foods that are high in fat and processed sugars, such as fried and sweet foods. °· Do not use any products that contain nicotine or tobacco. These   include cigarettes and e-cigarettes. If you need help quitting, ask your doctor. °· Avoid secondhand smoke. °· Wear compression stockings as told. These help to prevent blood clots and reduce swelling in your legs. °· If you have a chest tube, care for it as told. °· Keep all follow-up visits as told by your doctor. This is important. °Contact a doctor if: °· You have more redness, swelling, or pain around your cut from surgery. °· You have more fluid or blood coming from your cut from  surgery. °· Your cut from surgery feels warm to the touch. °· You have pus or a bad smell coming from your cut from surgery. °· You have a fever or chills. °· Your heartbeat seems uneven. °· You feel sick to your stomach (nauseous). °· You throw up (vomit). °· You have muscle aches. °· You have trouble pooping (having a bowel movement). This may mean that you: °¨ Poop fewer times in a week than normal. °¨ Have a hard time pooping. °¨ Have poop that is dry, hard, or bigger than normal. °Get help right away if: °· You get a rash. °· You feel light-headed. °· You feel like you might pass out (faint). °· You are short of breath. °· You have trouble breathing. °· You are confused. °· You have trouble talking. °· You have problems with your seeing (vision). °· You are not able to move. °· You lose feeling (have numbness) in your: °¨ Face. °¨ Arms. °¨ Legs. °· You pass out. °· You have a sudden, bad headache. °· You feel weak. °· You have chest pain. °· You have pain that: °¨ Is very bad. °¨ Gets worse, even with medicine. °Summary °· Take deep breaths, do breathing exercises, and cough often. This helps prevent lung infection (pneumonia). °· Do not drive until your doctor approves. Do not travel by airplane for 2 weeks after your chest tube is removed, or until your doctor says that this is safe. °· Check your cut from surgery every day for signs of infection. °· Eat a healthy diet. This includes fresh fruits and vegetables, whole grains, and low-fat (lean) proteins. °This information is not intended to replace advice given to you by your health care provider. Make sure you discuss any questions you have with your health care provider. °Document Released: 05/27/2012 Document Revised: 08/20/2016 Document Reviewed: 08/20/2016 °Elsevier Interactive Patient Education © 2017 [employer] ° °

## 2018-09-09 NOTE — Progress Notes (Signed)
Patient ID: Allen Marshall, male   DOB: September 19, 1941, 77 y.o.   MRN: [id number] TCTS  He was intubated and required epi bolus to maintain BP. Then as epi wore off his BP trended down into the 40's despite high dose NE and neo. ABG showed marked metabolic acidosis. He was given another epi bolus without any increase in BP and went into PEA arrest with no cardiac activity. Given his progressive downhill spiral today despite escalating dose of inotropes and vasopressors with marked hypotension and metabolic acidosis I do not feel there is anything else that can be done for him. I suspect acute cardiac failure of undetermined cause and that may be why his INR got so high prior to admission. Patient expired at 9:00 pm.

## 2018-09-09 NOTE — Progress Notes (Signed)
1 Day Post-Op Procedure(s) (LRB): VIDEO ASSISTED THORACOSCOPY (VATS)/THOROCOTOMY (Right) DRAINAGE OF PLEURAL EFFUSION (Right) DECORTICATION (Right) Subjective: Intubated on Precedex 0.2 but responds appropriately  Objective: Vital signs in last 24 hours: Temp:  [96.2 F (35.7 C)-98.3 F (36.8 C)] 98.2 F (36.8 C) (09/06 0400) Pulse Rate:  [33-124] 68 (09/06 0600) Cardiac Rhythm: Atrial fibrillation (09/06 0400) Resp:  [12-24] 14 (09/06 0700) BP: (82-135)/(49-79) 97/65 (09/06 0700) SpO2:  [88 %-100 %] 100 % (09/06 0744) Arterial Line BP: (82-142)/(45-65) 106/55 (09/06 0700) FiO2 (%):  [32 %-50 %] 50 % (09/06 0744) Weight:  [79.3 kg] 79.3 kg (09/06 0400)  Hemodynamic parameters for last 24 hours:    Intake/Output from previous day: 09/05 0701 - 09/06 0700 In: 5353.5 [I.V.:4058.5; Blood:945; IV Piggyback:350] Out: 5130 [Urine:480; Emesis/NG output:300; Blood:200; Chest Tube:650] Intake/Output this shift: No intake/output data recorded.  General appearance: alert and cooperative Heart: irregularly irregular rhythm Lungs: few rhonchi on right chest tube output serosanguinous, no air leak  Lab Results: Recent Labs    08/18/2018 1659 09/07/2018 0415  WBC 7.7 8.8  HGB 9.7* 8.9*  HCT 31.7* 29.3*  PLT 339 198   BMET:  Recent Labs    08/10/2018 0428  09/05/2018 1455 09/07/2018 1545 09-07-18 0415  NA 140   < > 140 143 142  K 3.9   < > 3.7 4.4 3.8  CL 107  --   --   --  110  CO2 23  --   --   --  22  GLUCOSE 131*  --  157*  --  161*  BUN 38*  --   --   --  29*  CREATININE 1.32*  --   --   --  1.45*  CALCIUM 8.3*  --   --   --  8.1*   < > = values in this interval not displayed.    PT/INR: No results for input(s): LABPROT, INR in the last 72 hours. ABG    Component Value Date/Time   PHART 7.465 (H) 09/07/2018 0410   HCO3 22.9 Sep 07, 2018 0410   TCO2 26 08/12/2018 1804   ACIDBASEDEF 0.4 09/07/18 0410   O2SAT 99.2 09-07-18 0410   CBG (last 3)  Recent Labs   08/30/2018 1743 09/03/2018 2023 2018/09/07 0332  GLUCAP 117* 139* 142*   CXR: improved aeration on the right. No significant residual effusion.  Assessment/Plan: S/P Procedure(s) (LRB): VIDEO ASSISTED THORACOSCOPY (VATS)/THOROCOTOMY (Right) DRAINAGE OF PLEURAL EFFUSION (Right) DECORTICATION (Right)  POD 1 He has had labile BP postop and is on some neo at 65 mcg. CXR stable and ABG ok so will stop Precedex and wean vent to extubate. His right lung was collapsed for a while so it may not expand completely and he may continue to have a small space. Continue chest tube to suction.  Chronic atrial fib: rate controlled. He did receive 0.5 mg digoxin yesterday. Can't be anticoagulated for a while given his presentation with hemothorax and raw plerual surface.     LOS: 5 days    Alleen Borne 09-07-18

## 2018-09-09 NOTE — Procedures (Signed)
Extubation Procedure Note  Patient Details:   Name: Allen Marshall DOB: 05-21-1941 MRN: [id number]   Airway Documentation:  Airway 8 mm (Active)  Secured at (cm) 23 cm 08/13/2018  8:30 AM  Measured From Lips 08/19/2018  8:30 AM  Secured Location Right 08/27/2018  8:30 AM  Secured By Pink Tape 09/02/2018  8:30 AM  Tube Holder Repositioned Yes 08/17/2018  7:44 AM  Cuff Pressure (cm H2O) 22 cm H2O 08/17/2018  7:44 AM  Site Condition Cool;Dry 09/02/2018  7:44 AM   Vent end date: 08/12/2018 Vent end time: 1010   Evaluation  O2 sats: currently acceptable: o2 saturation fell to high 80's with 6lpm N/C. The patient was placed on a NRB mask with o2 saturation at 93%. Complications: No apparent complications Patient did tolerate procedure well. Bilateral Breath Sounds: Clear   Yes: patient was able to breathe around the ETT pre extubation and speak post extubation.   Nell Range 09/02/2018, 10:22 AM

## 2018-09-09 NOTE — Progress Notes (Signed)
Co-Ox results discussed with MD Bartle and patient status and vital signs discussed.  New orders received.

## 2018-09-09 NOTE — Procedures (Signed)
Extubation Procedure Note  Pt expired @ 2100. Dr. Laneta Simmers verbal ok to extubated. Not ME case.  Patient Details:   Name: Allen Marshall DOB: 1941-11-16 MRN: [id number]   Airway Documentation:    Vent end date: 09/02/2018 Vent end time: 2105   Evaluation Pt expired    [name] 08/17/2018, 9:25 PM

## 2018-09-09 NOTE — Progress Notes (Signed)
1400: Patient's heart rate sustaining in 140's and BP continues to be hypotensive after titration of Neosynephrine.  Paged and spoke with MD [facility], new orders received.    1500: MD Bartle rounding bedside on patient.  MD aware of patient's vital signs and current medication administrations.  New orders received, and RN to continue to monitor patient vitals.

## 2018-09-09 NOTE — Progress Notes (Signed)
ABG results called to MD Bartle.  MD Bartle advised okay to extubate and if needed, place patient on bipap.  MD Bartle advised to check an ABG one hour after extubation.

## 2018-09-09 NOTE — Progress Notes (Signed)
Patient ID: Allen Marshall, male   DOB: May 05, 1941, 77 y.o.   MRN: [id number] [facility] Evening Rounds:  He weaned on vent this am and was extubated with good weaning parameters and acceptable ABG. He developed increased oxygen requirement and work of breathing and required bipap. He also developed hypotension requiring increasing dose of NE and neo. CVP 14 and Co-ox 26, repeat Co-ox 27. I started some Milrinone 0.3. This has not inproved his hemodynamics. Repeat ABG shows sat of 95% on 90% bipap. Marked metabolic acidosis with BE -11 with respiratory compensation. Repeat CXR this afternoon unchanged with fairly clear lungs, no ptx, cardiac shadow normal sized. ECG shows atrial fib with some inferior ST abnormality but the baseline is wandering and hard to tell how significant that is. Troponin is 0.13 which is not elevated much and may just be stress related. It is not clear to me why he has had this downward spiral today after extubation. We don't know about his LV or RV function but an echo in 02/2017 showed normal LV and RV function. Admission ECG showed NSR with no acute changes. I doubt PE since he has been anticoagulated until a few hrs preop and has had SCD's on since.   Increased work of breathing on bipap. Lungs: fairly clear Heart: Irreg, no murmur Abd: no BS, soft, nontender Ext: moderate LE edema, feet cool.  I think the only option at this time is intubation because he is going to wear out. Discussed with his sister and she is in agreement. She understands that he has progressively worsened today.

## 2018-09-09 NOTE — Progress Notes (Signed)
CRITICAL VALUE ALERT  Critical Value:  Troponin .13  Date & Time Notied:  08/12/2018  At 1851  Provider Notified: will notify MD Bartle during evening rounds.

## 2018-09-09 DEATH — deceased

## 2018-09-10 ENCOUNTER — Ambulatory Visit: Payer: Medicare HMO | Admitting: Surgery

## 2018-10-07 ENCOUNTER — Ambulatory Visit: Payer: Medicare HMO | Admitting: Cardiology

## 2019-10-13 IMAGING — DX DG CHEST 2V
2 series · 2 of 2 positions shown · non-contrast
Comparison: Chest radiograph 07/29/2018

CLINICAL DATA: Patient with worsening shortness of breath.

EXAM:
CHEST - 2 VIEW

[chest lat]
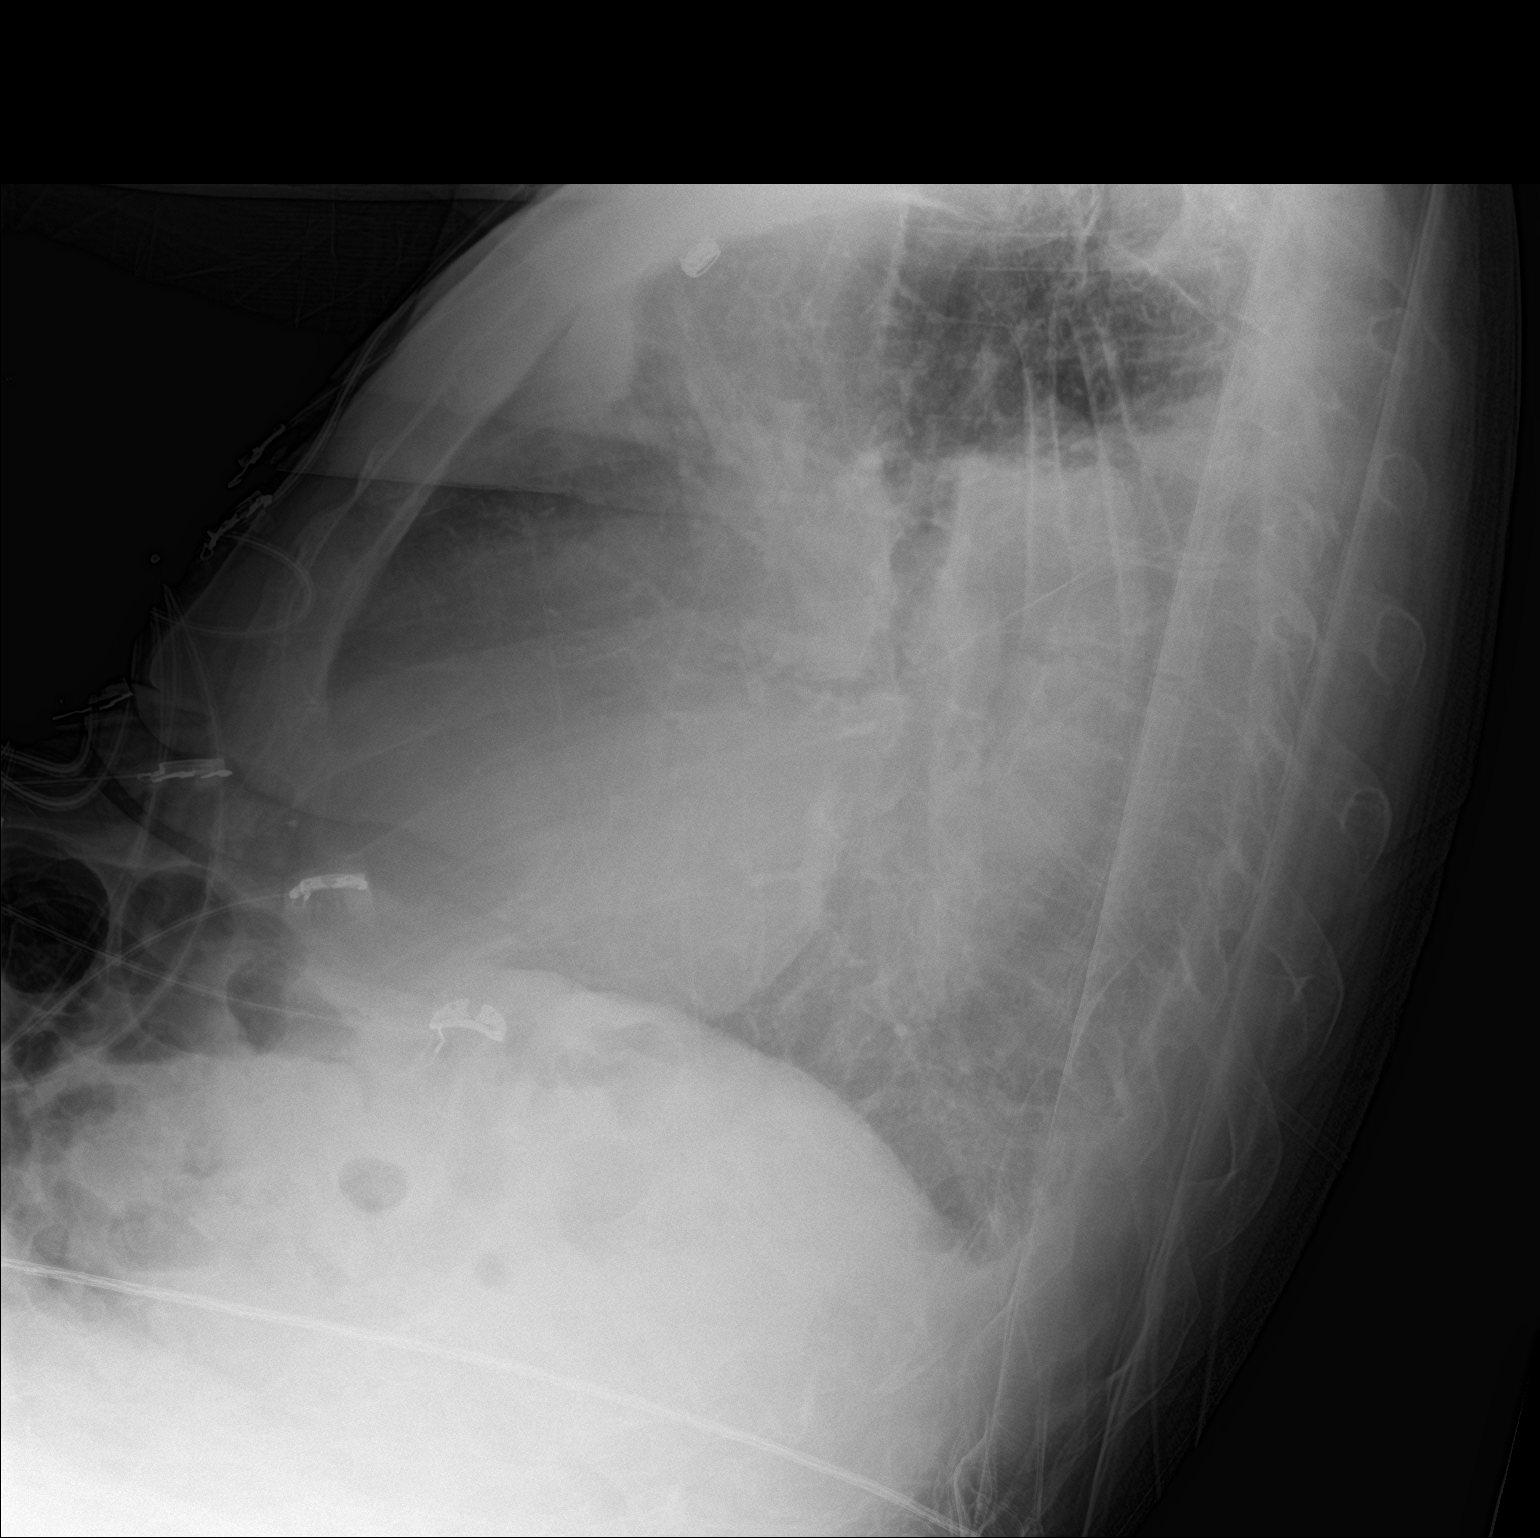

[chest ap]
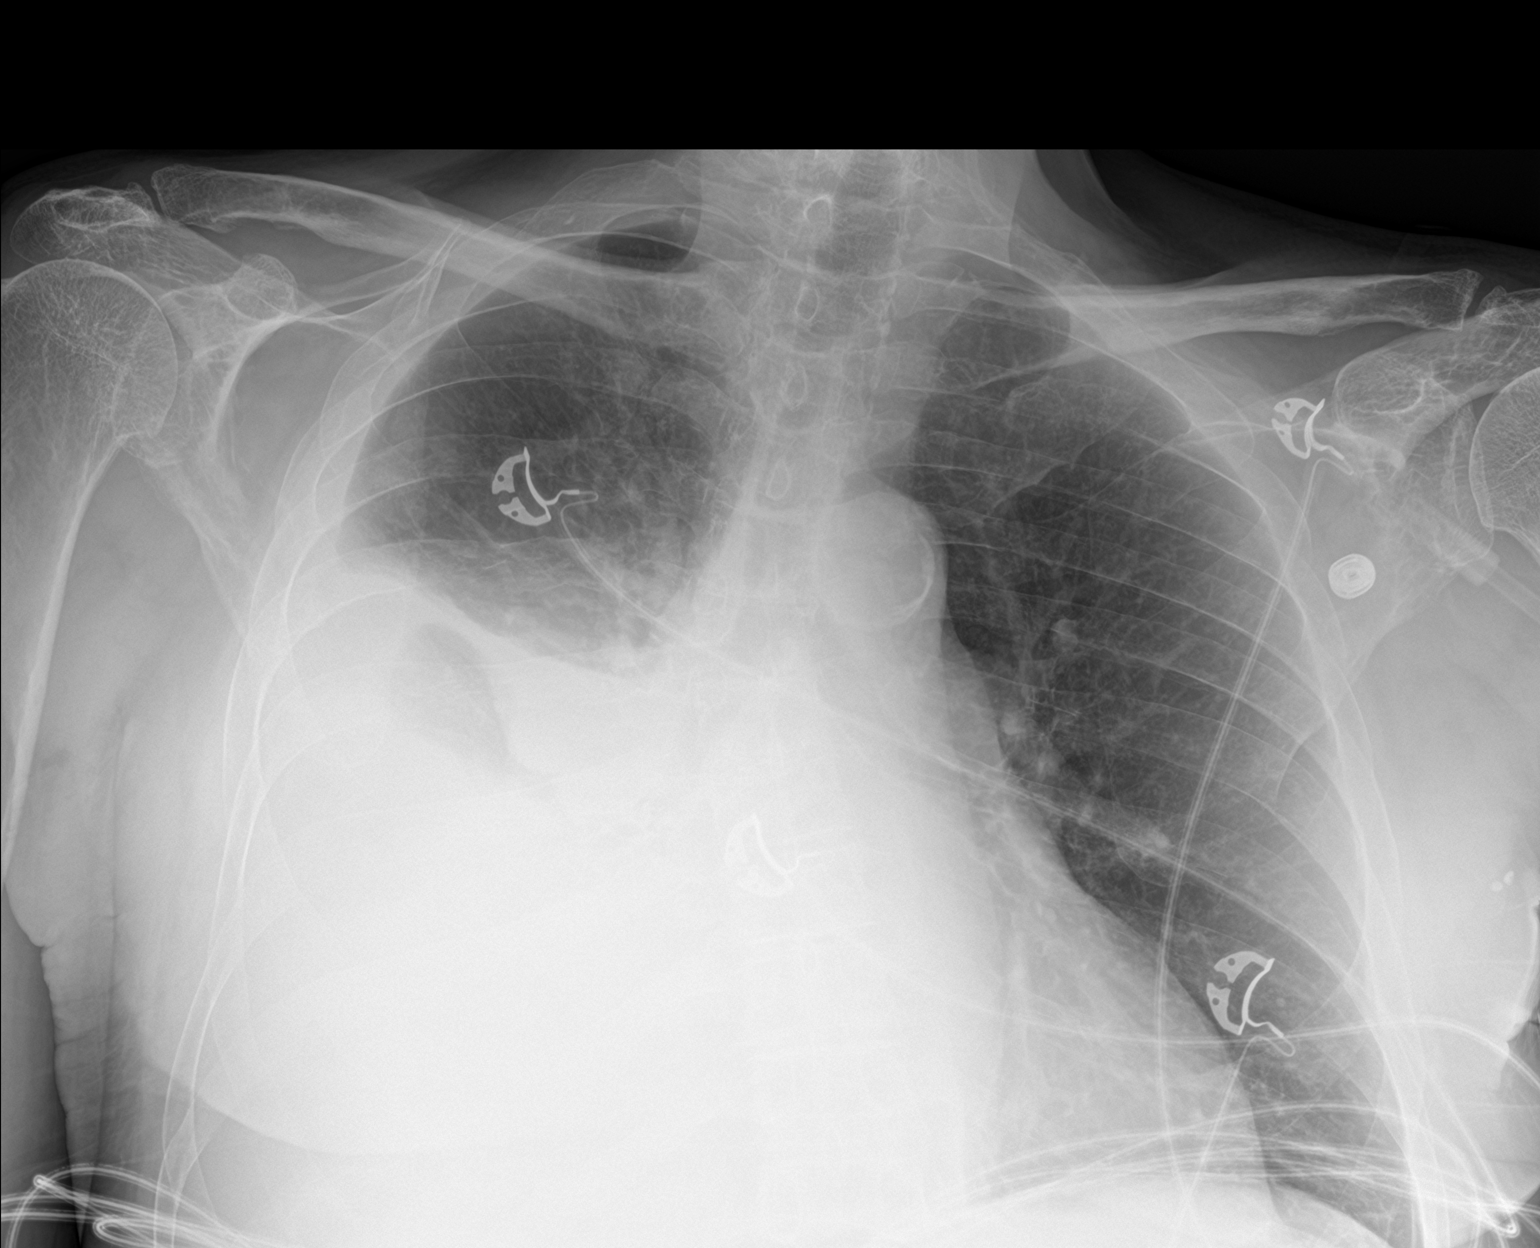

[2 of 2 positions shown; findings below may reference images not displayed]

FINDINGS: Monitoring leads overlie the patient. Stable cardiomegaly. Aortic
atherosclerosis. Interval increase in size of large right pleural
effusion with underlying consolidation. No pneumothorax. Left lung
is clear.
IMPRESSION: Interval increase in size of large effusion with underlying
consolidation.

## 2019-10-15 IMAGING — CT CT CHEST W/O CM
2 of 3 series · 15 of 36 positions shown, 18 images · non-contrast
Comparison: Chest CT 04/14/2018.

CLINICAL DATA: 77-year-old male with history of increasing
shortness of breath after thoracentesis today. Pleural effusion.

EXAM:
CT CHEST WITHOUT CONTRAST
TECHNIQUE: Multidetector CT imaging of the chest was performed following the
standard protocol without IV contrast.

[Series 2: thorax · axial · 0.75mm/px · z∈[+686,+976]mm · 12 of 171 slices shown, 15 images]
[im 13/171  mediastinal]
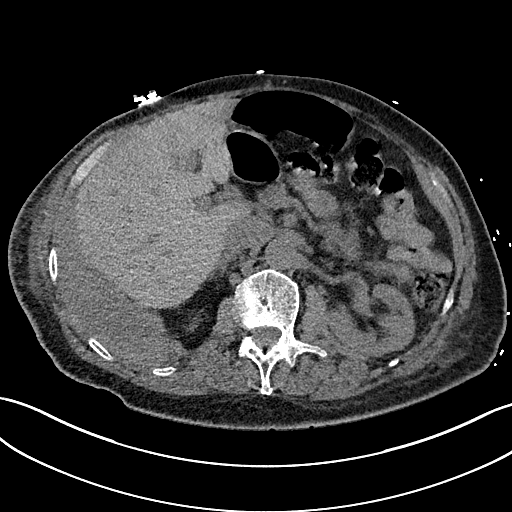
[im 13/171  lung]
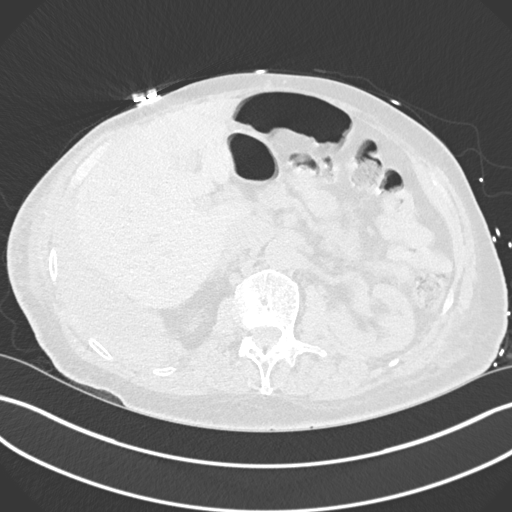
[im 26/171  lung]
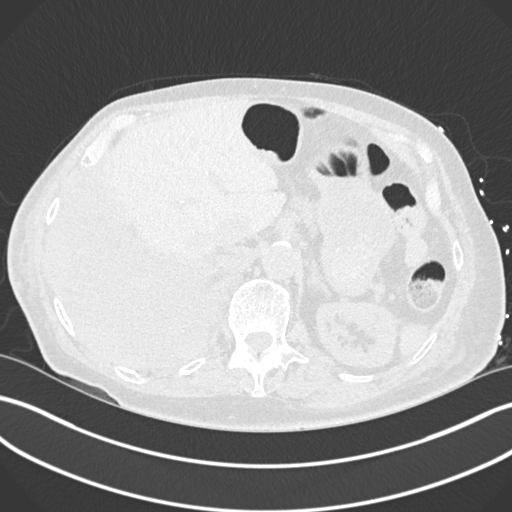
[im 38/171  lung]
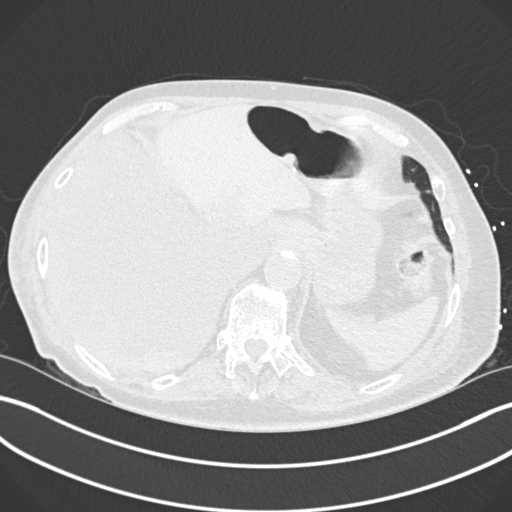
[im 51/171  lung]
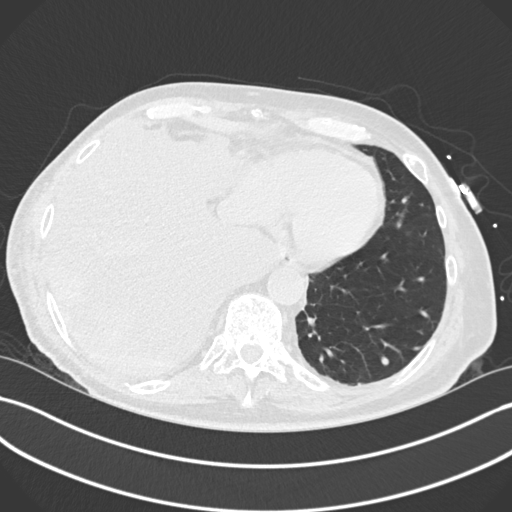
[im 63/171  mediastinal]
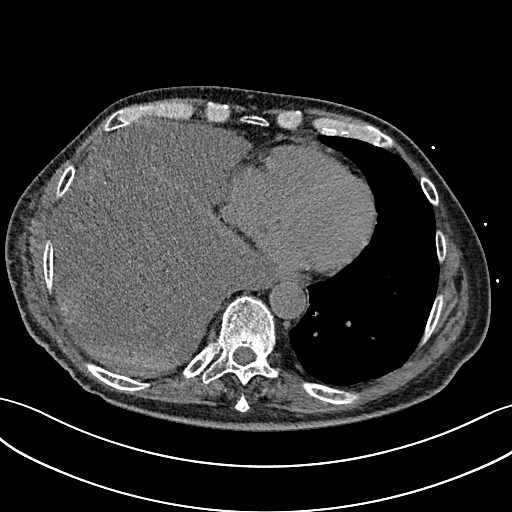
[im 63/171  lung]
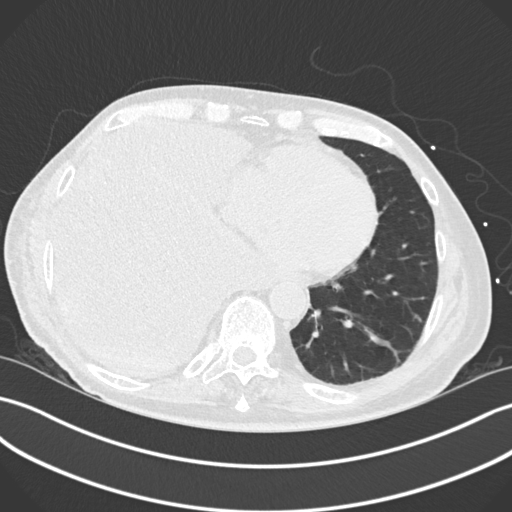
[im 76/171  lung]
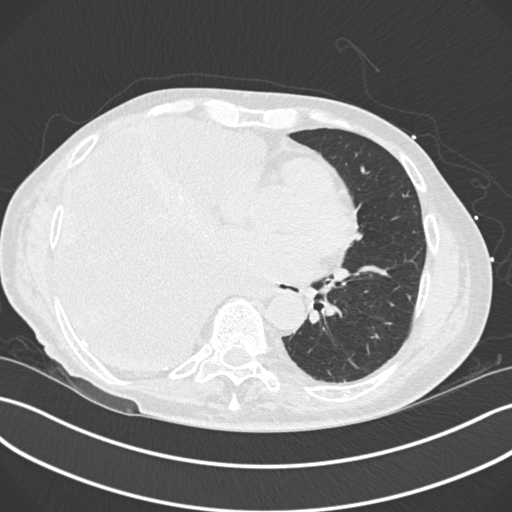
[im 95/171  lung]
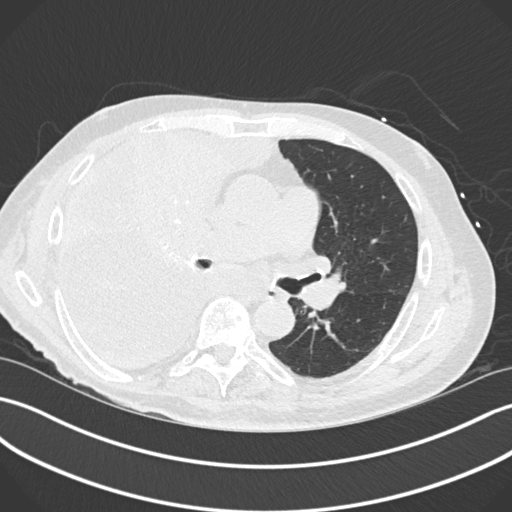
[im 108/171  lung]
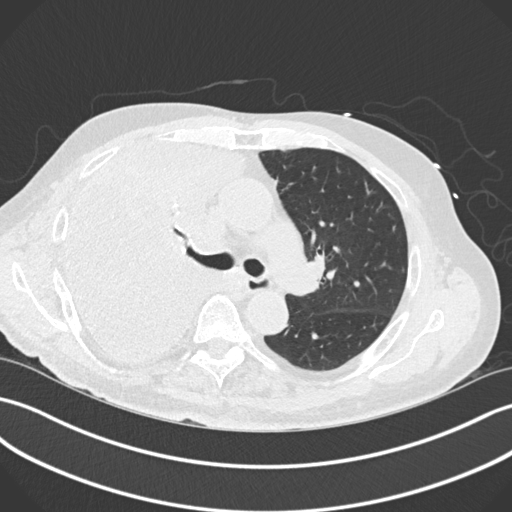
[im 120/171  mediastinal]
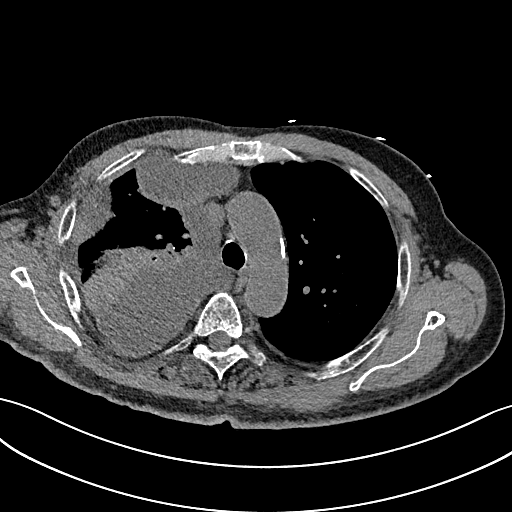
[im 120/171  lung]
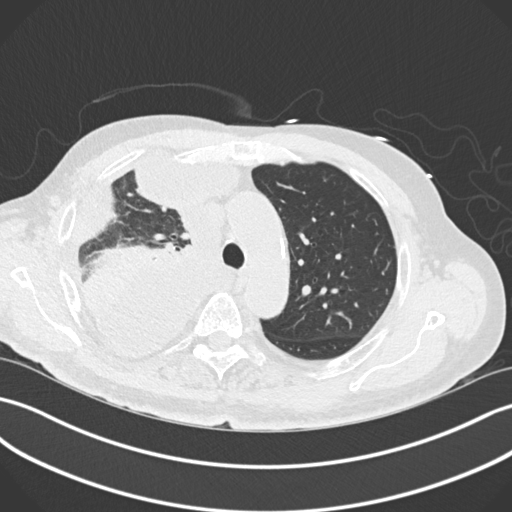
[im 133/171  lung]
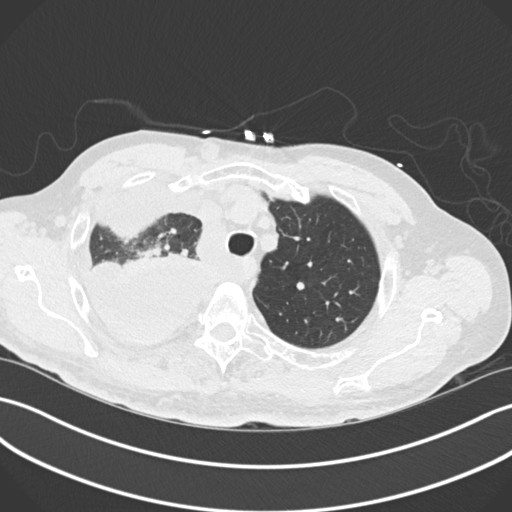
[im 145/171  lung]
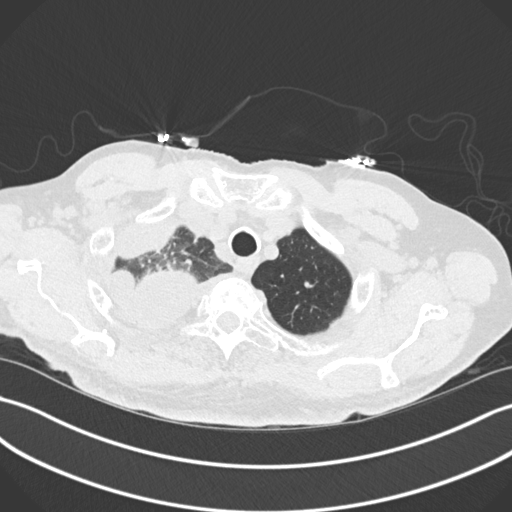
[im 158/171  lung]
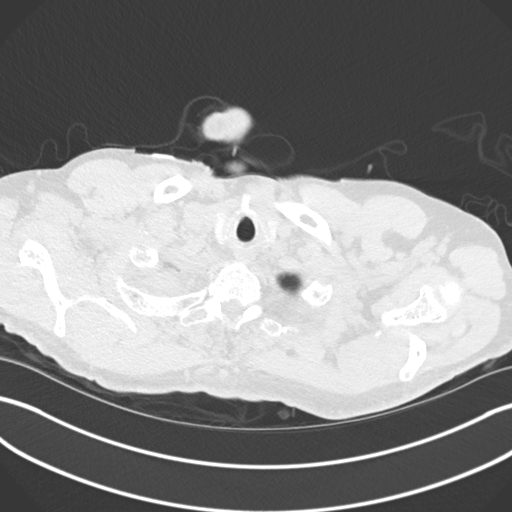

[Series 5: coronal · coronal · 0.72mm/px · 3 of 135 slices shown]
[im 27/135  lung]
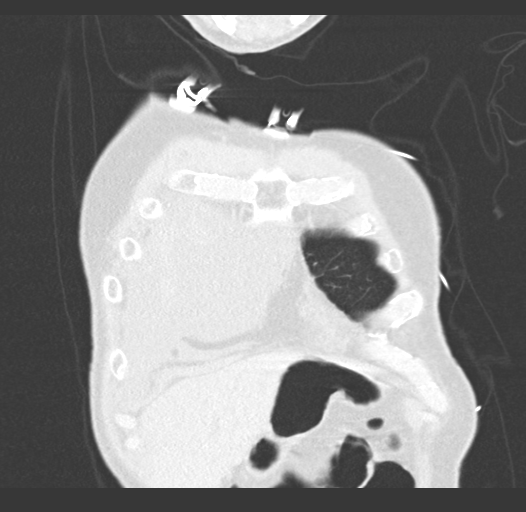
[im 54/135  lung]
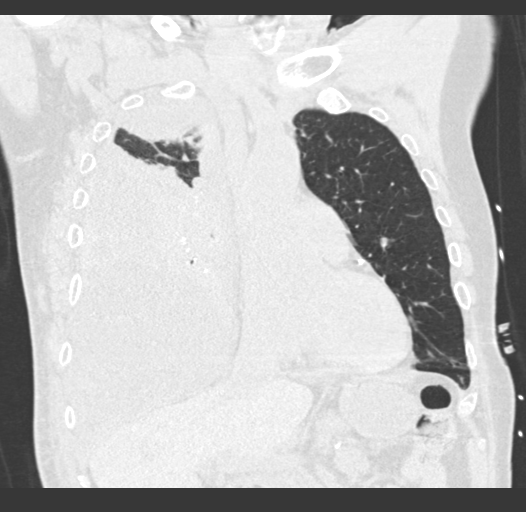
[im 81/135  lung]
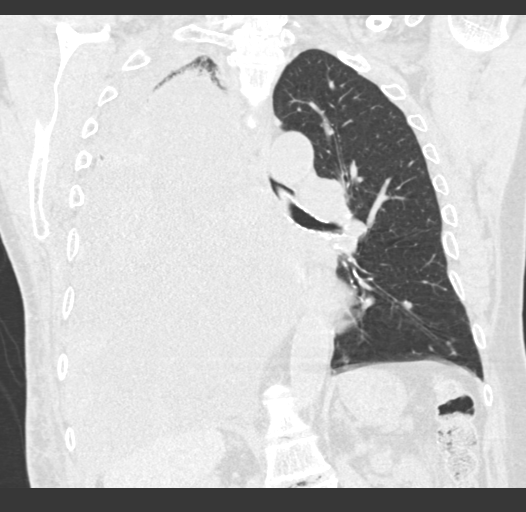

[15 of 36 positions shown; findings below may reference images not displayed]

FINDINGS: Cardiovascular: Heart size is normal. There is no significant
pericardial fluid, thickening or pericardial calcification. There is
aortic atherosclerosis, as well as atherosclerosis of the great
vessels of the mediastinum and the coronary arteries, including
calcified atherosclerotic plaque in the left anterior descending,
left circumflex and right coronary arteries.

Mediastinum/Nodes: No pathologically enlarged mediastinal or hilar
lymph nodes. Please note that accurate exclusion of hilar adenopathy
is limited on noncontrast CT scans. Esophagus is unremarkable in
appearance. No axillary lymphadenopathy.

Lungs/Pleura: Large complex right pleural effusion with
heterogeneous attenuation ranging from low to high attenuation,
presumably reflective of internal blood products. Based on the
complex appearance, this appears to be likely highly loculated.
Extensive passive atelectasis throughout most of the right lung,
with small amount of aerated right upper lobe. Small calcified
pleural plaques in the right hemithorax. No suspicious appearing
pulmonary nodules or masses are noted in the left lung. No left
pleural effusion.

Upper Abdomen: Aortic atherosclerosis.

Musculoskeletal: There are no aggressive appearing lytic or blastic
lesions noted in the visualized portions of the skeleton.
IMPRESSION: 1. Large complex right-sided pleural effusion which is likely highly
loculated and contains either hemorrhagic or proteinaceous contents.
2. Aortic atherosclerosis, in addition to three-vessel coronary
artery disease. Please note that although the presence of coronary
artery calcium documents the presence of coronary artery disease,
the severity of this disease and any potential stenosis cannot be
assessed on this non-gated CT examination. Assessment for potential
risk factor modification, dietary therapy or pharmacologic therapy
may be warranted, if clinically indicated.
3. Additional incidental findings, as above.

Aortic Atherosclerosis ([id number]).

## 2019-10-15 IMAGING — DX DG CHEST 1V
1 series · 1 of 1 positions shown · non-contrast
Comparison: 08/10/2018

CLINICAL DATA: RIGHT pleural effusion post thoracentesis

EXAM:
CHEST  1 VIEW

[chest pa]
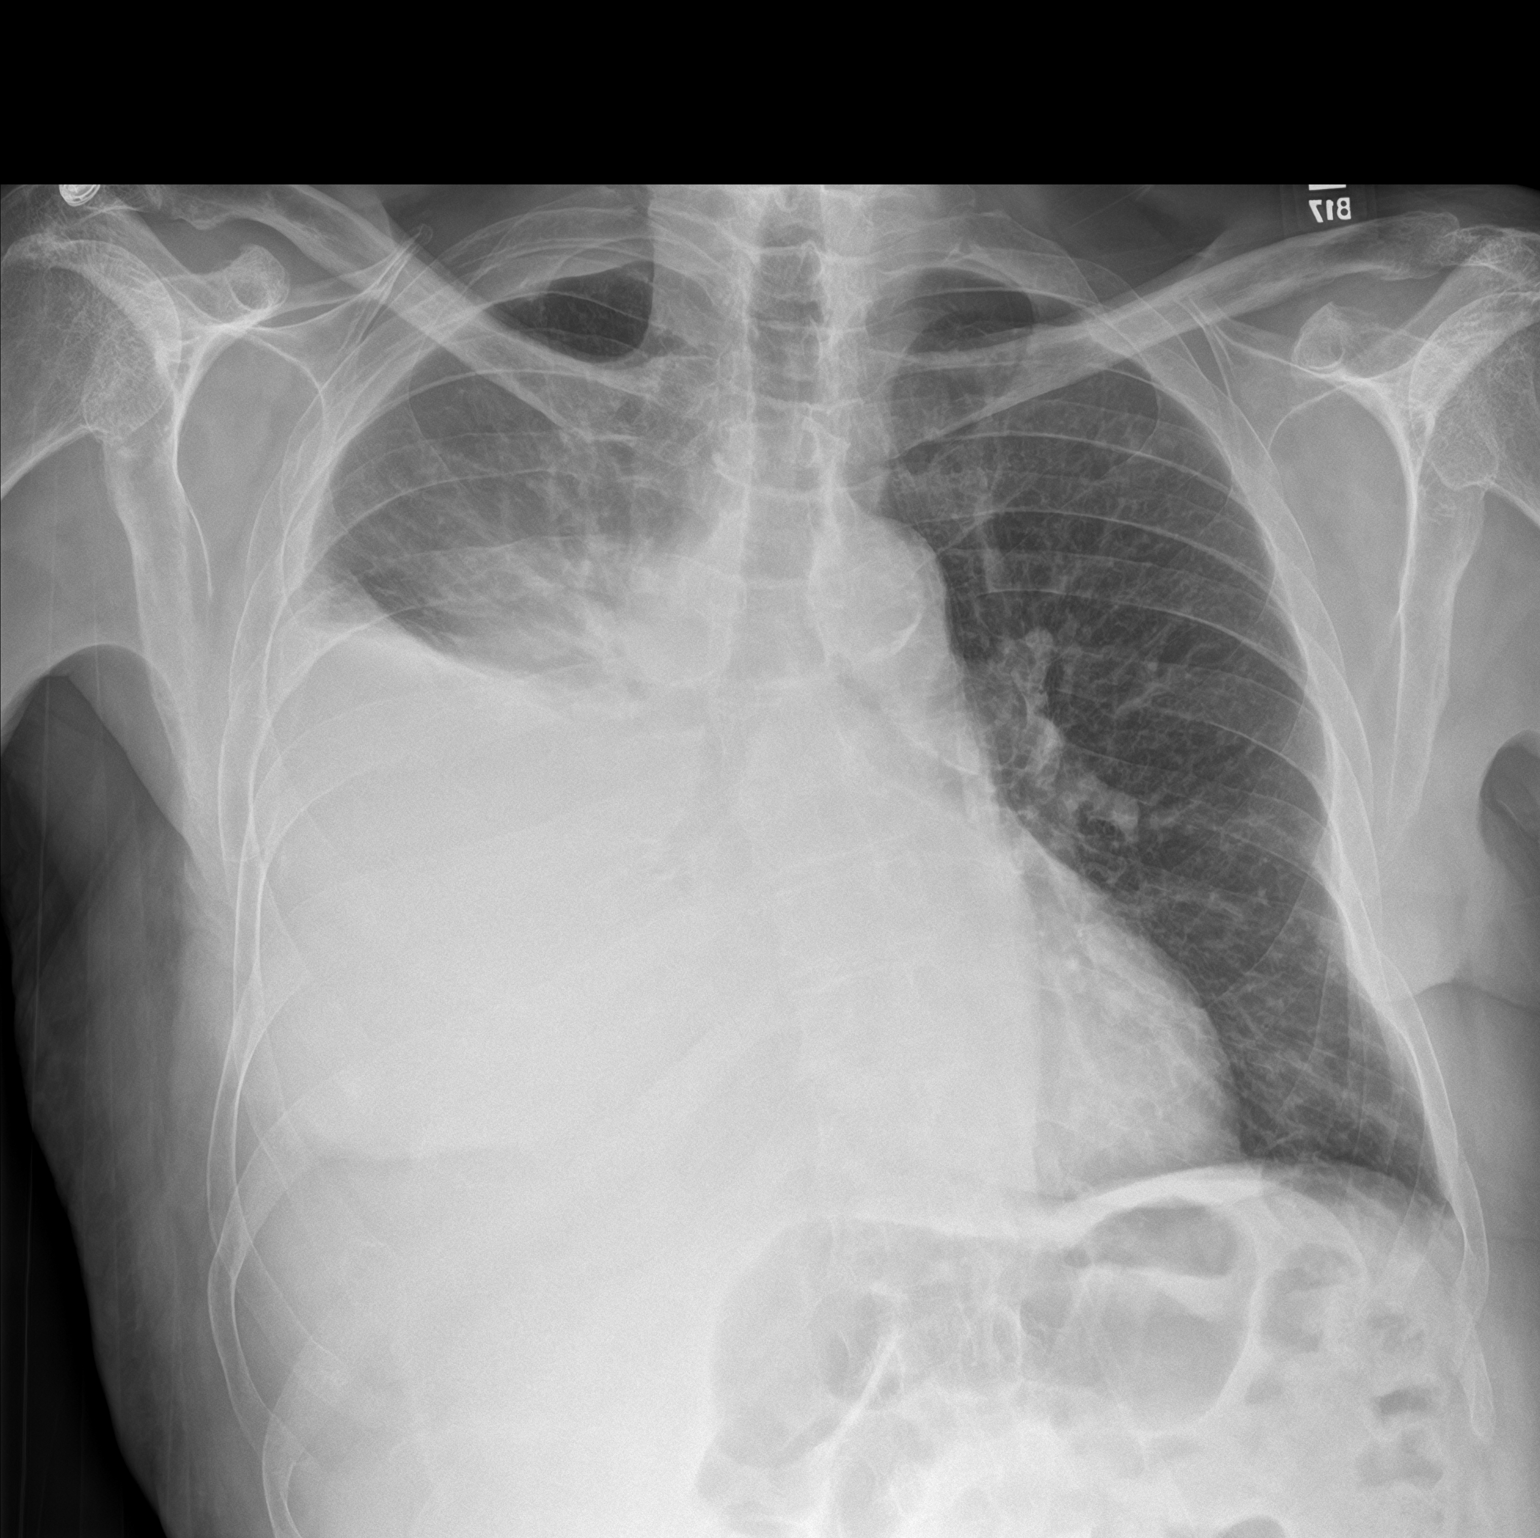

[1 of 1 positions shown; findings below may reference images not displayed]

FINDINGS: Persistent large RIGHT pleural effusion and significant RIGHT lung
atelectasis.

No pneumothorax following thoracentesis.

LEFT lung clear.

Atherosclerotic calcification aorta.

Heart size stable.
IMPRESSION: No pneumothorax following RIGHT thoracentesis.

Persistent large RIGHT pleural effusion and significant RIGHT lung
atelectasis.
